# Patient Record
Sex: Male | Born: 1950
Health system: Southern US, Community
[De-identification: ages and names within clinical notes are randomized; demographics above are authoritative.]

## PROBLEM LIST (undated history)

## (undated) DIAGNOSIS — C801 Malignant (primary) neoplasm, unspecified: Secondary | ICD-10-CM

## (undated) DIAGNOSIS — I219 Acute myocardial infarction, unspecified: Secondary | ICD-10-CM

## (undated) DIAGNOSIS — E785 Hyperlipidemia, unspecified: Secondary | ICD-10-CM

## (undated) DIAGNOSIS — R9430 Abnormal result of cardiovascular function study, unspecified: Secondary | ICD-10-CM

## (undated) DIAGNOSIS — H35719 Central serous chorioretinopathy, unspecified eye: Secondary | ICD-10-CM

## (undated) DIAGNOSIS — I2510 Atherosclerotic heart disease of native coronary artery without angina pectoris: Secondary | ICD-10-CM

## (undated) DIAGNOSIS — K219 Gastro-esophageal reflux disease without esophagitis: Secondary | ICD-10-CM

## (undated) DIAGNOSIS — I10 Essential (primary) hypertension: Secondary | ICD-10-CM

## (undated) DIAGNOSIS — I00 Rheumatic fever without heart involvement: Secondary | ICD-10-CM

## (undated) DIAGNOSIS — M1990 Unspecified osteoarthritis, unspecified site: Secondary | ICD-10-CM

## (undated) DIAGNOSIS — S62101A Fracture of unspecified carpal bone, right wrist, initial encounter for closed fracture: Secondary | ICD-10-CM

## (undated) DIAGNOSIS — N529 Male erectile dysfunction, unspecified: Secondary | ICD-10-CM

## (undated) DIAGNOSIS — B029 Zoster without complications: Secondary | ICD-10-CM

## (undated) HISTORY — PX: OTHER SURGICAL HISTORY: SHX169

## (undated) HISTORY — DX: Male erectile dysfunction, unspecified: N52.9

## (undated) HISTORY — PX: CARDIAC CATHETERIZATION: SHX172

## (undated) HISTORY — PX: SPINE SURGERY: SHX786

## (undated) HISTORY — DX: Essential (primary) hypertension: I10

## (undated) HISTORY — PX: CORONARY ANGIOPLASTY: SHX604

## (undated) HISTORY — DX: Hyperlipidemia, unspecified: E78.5

## (undated) HISTORY — DX: Fracture of unspecified carpal bone, right wrist, initial encounter for closed fracture: S62.101A

## (undated) HISTORY — DX: Zoster without complications: B02.9

## (undated) HISTORY — PX: JOINT REPLACEMENT: SHX530

## (undated) HISTORY — DX: Unspecified osteoarthritis, unspecified site: M19.90

## (undated) HISTORY — DX: Central serous chorioretinopathy, unspecified eye: H35.719

---
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
## 1997-09-20 ENCOUNTER — Other Ambulatory Visit: Admission: RE | Admit: 1997-09-20 | Discharge: 1997-09-20 | Payer: Self-pay | Admitting: Rheumatology

## 1999-10-23 ENCOUNTER — Ambulatory Visit (HOSPITAL_COMMUNITY): Admission: RE | Admit: 1999-10-23 | Discharge: 1999-10-23 | Payer: Self-pay | Admitting: Gastroenterology

## 1999-10-23 ENCOUNTER — Encounter (INDEPENDENT_AMBULATORY_CARE_PROVIDER_SITE_OTHER): Payer: Self-pay | Admitting: Specialist

## 2001-09-05 ENCOUNTER — Encounter: Payer: Self-pay | Admitting: Rheumatology

## 2001-09-05 ENCOUNTER — Encounter: Admission: RE | Admit: 2001-09-05 | Discharge: 2001-09-05 | Payer: Self-pay | Admitting: Rheumatology

## 2002-04-27 ENCOUNTER — Inpatient Hospital Stay (HOSPITAL_COMMUNITY): Admission: RE | Admit: 2002-04-27 | Discharge: 2002-04-30 | Payer: Self-pay | Admitting: Neurosurgery

## 2004-07-25 ENCOUNTER — Encounter: Admission: RE | Admit: 2004-07-25 | Discharge: 2004-07-25 | Payer: Self-pay | Admitting: Rheumatology

## 2004-08-23 ENCOUNTER — Ambulatory Visit (HOSPITAL_COMMUNITY): Admission: RE | Admit: 2004-08-23 | Discharge: 2004-08-23 | Payer: Self-pay | Admitting: Gastroenterology

## 2006-11-13 ENCOUNTER — Encounter: Admission: RE | Admit: 2006-11-13 | Discharge: 2006-11-13 | Payer: Self-pay | Admitting: Rheumatology

## 2007-03-04 ENCOUNTER — Encounter: Admission: RE | Admit: 2007-03-04 | Discharge: 2007-03-04 | Payer: Self-pay | Admitting: Rheumatology

## 2007-03-20 ENCOUNTER — Inpatient Hospital Stay (HOSPITAL_COMMUNITY): Admission: RE | Admit: 2007-03-20 | Discharge: 2007-03-23 | Payer: Self-pay | Admitting: Specialist

## 2008-04-16 DIAGNOSIS — B029 Zoster without complications: Secondary | ICD-10-CM

## 2008-04-16 HISTORY — DX: Zoster without complications: B02.9

## 2010-08-29 NOTE — H&P (Signed)
Marcus Peterson, Marcus Peterson             ACCOUNT NO.:  [id number]   MEDICAL RECORD NO.:  [id number]         PATIENT TYPE:  LINP   LOCATION:                               FACILITY:  [facility]   PHYSICIAN:  Erasmo Leventhal, M.D.DATE OF BIRTH:  1950-07-12   DATE OF ADMISSION:  03/20/2007  DATE OF DISCHARGE:                              HISTORY & PHYSICAL   CHIEF COMPLAINT:  End-stage osteoarthritis of left knee with rheumatoid  arthritis.   BRIEF HISTORY:  This is a 60 year old gentleman with a history  rheumatoid arthritis with advanced degeneration of his left knee that  has failed conservative treatment.  After discussion of treatment,  benefits, risks and options, the patient is now scheduled for total knee  arthroplasty of his left knee.  He will stop his methotrexate and Enbrel  prior to surgery per Dr. Katina Degree recommendations.   PAST MEDICAL HISTORY:   DRUG ALLERGIES:  None.   CURRENT MEDICATIONS:  1. Methotrexate 2.5 mg 8 tablets each Friday.  2. Benazepril 10 mg p.o. daily.  3. Folic acid 1 mg p.o. daily.  4. Enbrel 50 mg one shot each Sunday.  5. Vitamin D 1000 units a day.  6. Calcium 600 mg per day.   PREVIOUS SURGERIES:  1. Tonsillectomy.  2. Lumbar laminectomy.  3. Elbow debridement.   SERIOUS MEDICAL ILLNESSES:  1. Rheumatoid arthritis.  2. Hypertension.   FAMILY HISTORY:  Positive for coronary artery disease, hypertension and  cancer.   SOCIAL HISTORY:  The patient is married.  He is a Consulting civil engineer.  He lives at home.  He does not smoke or drink.   REVIEW OF SYSTEMS:  NEUROLOGIC:  Negative for headache, blurry vision or  dizziness.  PULMONARY:  Negative for shortness breath, PND and  orthopnea.  CARDIOVASCULAR:  Negative for chest pain, palpitations.  GI:  Negative for ulcers, hepatitis.  GU:  Negative for urinary tract  difficulty.  MUSCULOSKELETAL:  Positive as in HPI.   PHYSICAL EXAMINATION:  VITAL SIGNS:  Blood pressure 140/100,  respirations  18, pulse 72 and regular.  GENERAL APPEARANCE:  This is a well-developed, well-nourished gentleman  in no acute distress.  HEENT:  Head normocephalic.  Nose patent.  Ears patent.  Pupils equal,  round, react to light.  Throat without injection.  NECK:  Supple without adenopathy.  Carotids 2+ without bruit.  CHEST:  Clear to auscultation.  No rales or rhonchi.  Respirations 18.  HEART:  Regular rate  and rhythm at 72 beats per minute without murmur.  ABDOMEN:  Soft, active bowel sounds.  No masses, organomegaly.  NEUROLOGIC:  Patient alert and oriented  to time, place and person.  Cranial nerves II-XII grossly intact.  EXTREMITIES:  Shows the stigmata of rheumatoid arthritis with synovitis  of multiple joints.  His left knee shows 3 degrees flexion and  retraction with further flexion to 135 degrees.  Dorsalis pedis and  posterior tibial pulses are 1+.  Sensation and circulation are intact.   X-rays show end-stage degenerative change of the left knee.   IMPRESSION:  End-stage degenerative change of the left knee with  rheumatoid arthritis.   PLAN OF ACTION:  Total knee arthroplasty, left knee sinus.      [name]. Chabon, P.A.    ______________________________  Erasmo Leventhal, M.D.    SJC/MEDQ  D:  02/18/2007  T:  02/18/2007  Job:  [id number]

## 2010-08-29 NOTE — Op Note (Signed)
Marcus Peterson, Marcus Peterson             ACCOUNT NO.:  [id number]   MEDICAL RECORD NO.:  [id number]          PATIENT TYPE:  INP   LOCATION:  0006                         FACILITY:  [facility]   PHYSICIAN:  Erasmo Leventhal, M.D.DATE OF BIRTH:  1950/10/12   DATE OF PROCEDURE:  03/20/2007  DATE OF DISCHARGE:                               OPERATIVE REPORT   PREOPERATIVE DIAGNOSIS:  Left knee end-stage rheumatoid arthritis.   POSTOPERATIVE DIAGNOSIS:  Left knee end-stage rheumatoid arthritis.   PROCEDURE:  Left total knee arthroplasty.   SURGEON:  Erasmo Leventhal, M.D.   ASSISTANT:  [name]. Chabon, P.A.-C.   ANESTHESIA:  General with femoral nerve block.   ESTIMATED BLOOD LOSS:  Less than 50 mL.   DRAINS:  One Hemovac.   COMPLICATIONS:  None.   TOURNIQUET TIME:  1 hour and 15 minutes at 300 mmHg.   COMPLICATIONS:  None.   DISPOSITION:  PACU stable.   DESCRIPTION OF PROCEDURE:  The patient was counseled in the holding  area.  The correct side was identified.  He was taken to operating room  and placed in the supine position in upright position.  A  spinal was  attempted but unsuccessful by the anesthesiologist.  He was laid supine  and placed under general anesthesia.  A Foley catheter was placed  utilizing sterile technique by the OR circulating nurse.  All  extremities were well-padded and bumped.  The left knee was examined.  He had a 3-degree flexion contracture, flexed to 115 degrees.  He was  elevated, prepped with DuraPrep, and draped in sterile fashion.  Exsanguinated with an Esmarch, and tourniquet was inflated to 300 mmHg.   A straight midline incision was made through the skin and subcutaneous  tissue.  Medial and lateral soft tissue flaps were developed at the  appropriate level.  Medial parapatellar arthrotomy was performed.  Proximal medial soft tissue release was done.  The patella was retracted  out of the way but not everted.  He had end-stage arthritic  changes,  bone against bone contact.  The cruciate ligaments were resected.  A  starter hole was made in the distal femur.  The canal was irrigated  until the effluent was clear.  Intramedullary rod was gently placed.  I  chose a 5-degree valgus cut and took a 10-mm cut off the distal femur.  The femur was found to be a size #5.  Rotation marks were set, and the  cutting block was applied.  The distal femur was cut to fit a size 5.  Medial and lateral menisci removed under direct visualization.  Geniculate vessels were coagulated.  Posterior neurovascular structures  were thawed of and protected throughout the entire case.  The tibial  eminence was resected.  The proximal was felt to be a size 5.  Osteophytes removed from the proximal and medial tibia.  Central aspect  was identified with reamer, step reamer, and canal was irrigated until  the effluent was clear.  Intramedullary rod was gently placed.  I chose  a 10-mm cut based upon the lateral side which was the least deficient  at  a 0-degree slope.  Posteromedial,  posterolateral femoral osteophytes  were removed under direct visualization.  Flexion/extension blocks for a  10 insert were well-balanced.  Tibial base plate was applied.  Rotation  covers were set, reamer, punch.  Femoral box cut was now performed.  At  this time, the size 5 tibia, size 5 femur, 10 insert well-balanced in  range of motion, soft tissue balance.  The patella was found to be a  size 38.  The appropriate amount of bone was resected.  __________  holes were made, patella button was applied.  We had anatomic  patellofemoral tracking.  All trials were removed.  The knee was  irrigated with pulsatile lavage.  Utilizing modern cement technique, all  components were cemented into place, size 5 tibia, size 5 femur, 38  patella.  He also had a cyst on the medial femoral condyle, and it was  debrided and appropriately filled with cement.  It was 4 mm deep by 4  mm.  At  this time, with trials of a 10 and 12.5 insert, with 12.5 insert  we had full extension, flexion to 120 __________ .  He was stable to  varus and valgus stress, 0-90 degrees of flexion, well-balanced in  flexion/extension.  Patellofemoral tracking was anatomic.  Trials were  removed, excess cement was removed, and the knee was again irrigated  with pulsatile lavage.  At this time, a final 12.5-mm posterior  stabilized rotating platform tibial insert was implanted.  He then had a  well-balanced, well-aligned, well-tracking knee.  The sequential closure  in layers were done.  The arthrotomy was closed with 90 degrees of  flexion with Vicryl, subcu Vicryl, the skin with a subcuticular Monocryl  suture.  Steri-Strips applied along with sterile dressing.  Tourniquet  deflated.  Normal circulation to foot and ankle at end of the case.  A  gram of Ancef given intravenously at the end of the case.  A femoral  nerve block was now administered by the anesthesiologist.   He was then awakened.  He was taken from the operating room to the PACU  in stable condition.  Sponge and needle counts correct.  No  complications or problems.  Help with surgical technique and decision  making, Mr. Leilani Able, P.A.-C. assisted throughout the entire case.   Components utilized were the [employer].  Sigma.  Size 5 femur, size 5 tibia, 12.5 posterior stabilized rotating  platform tibial insert, and a 38-mm all poly patella.  All cemented.           ______________________________  Erasmo Leventhal, M.D.     RAC/MEDQ  D:  03/20/2007  T:  03/20/2007  Job:  [id number]   cc:   Demetria Pore. Coral Spikes, M.D.  Fax: [phone]

## 2010-09-01 NOTE — Op Note (Signed)
Marcus Peterson, Marcus Peterson NO.:  [id number]   MEDICAL RECORD NO.:  [id number]                   PATIENT TYPE:  INP   LOCATION:  3040                                 FACILITY:  [facility]   PHYSICIAN:  Cristi Loron, M.D.            DATE OF BIRTH:  April 07, 1951   DATE OF PROCEDURE:  04/27/2002  DATE OF DISCHARGE:                                 OPERATIVE REPORT   PREOPERATIVE DIAGNOSES:  1. Lumbar 4-5 Grade I acquired spondylolisthesis.  2. Spinal stenosis.  3. Degenerative disk disease.  4. Lumbar radiculopathy.  5. Lumbago.   POSTOPERATIVE DIAGNOSES:  1. Lumbar 4-5 Grade I acquired spondylolisthesis.  2. Spinal stenosis.  3. Degenerative disk disease.  4. Lumbar radiculopathy.  5. Lumbago.   OPERATION PERFORMED:  1. Lumbar 4 Keel procedure.  2. Lumbar 4-5 posterior lumbar interbody fusion.  3. Placement of bilateral tangent interbody dowels (10 x 24 millimeters).  4. Posterior nonsegmental instrumentation, lumbar 4-5, with CP Horizon M10     titanium pedicle screws and rods.  5. Posterolateral arthrodesis with local morselized autograft bone and Vitox     bone scaffolding.   SURGEON:  Cristi Loron, M.D.   ASSISTANT:  Coletta Memos, M.D.   ANESTHESIA:  General endotracheal.   ESTIMATED BLOOD LOSS:  Three-hundred-fifty cubic centimeters.   SPECIMENS:  None.   DRAINS:  None.   COMPLICATIONS:  None.   BRIEF HISTORY:  The patient is a 60 year old white male who suffers from  back and leg pain, and who has failed medical management.  He was worked up  with a lumbar MRI, which demonstrated a Grade I acquired spondylolisthesis  at L4-5 with severe spinal stenosis and degenerative disk disease.  I  discussed the various treatment options with the patient including surgery.  The patient weighed the risks, benefits and alternatives to surgery, and  decided to proceed with the operation.   DESCRIPTION OF OPERATION:  The patient was brought  to the operating room by  the anesthesia team.  General endotracheal anesthesia was induced.  The  patient was then turned to the prone position on the Wilson frame.  His  lumbosacral region was then shaved and prepared with Betadine scrub and  Betadine solution.  Sterile drapes were applied.  I then injected the area  to be incised with Marcaine with epinephrine solution.   I used a scalpel to make a linear midline incision over the L4-5 interspace.  I used electrocautery to dissect down to the thoracolumbar fascia.  I  divided the fascia bilaterally performing a bilateral subperiosteal  dissection, shifting the paraspinous musculature from the spinous process of  the lamina of L4 and L5.  I inserted the McCullough retractor for exposure  and then obtained intraoperative radiograph to confirm my location.  We then  began at L4-5 by incising the L4-5 and L3-4 interspinous ligaments.  We used  the [employer] rongeur to  remove the L4 spinous process and part of the L4  lamina.  This bone was saved and later used during the fusion process after  it was cleared of soft tissue.  We then used the high-speed drill to perform  bilateral L4 laminotomies as well as to remove the medial aspect of the L4-5  facet joints.   We completed the L4 laminectomy with the Kerrison punch removing the L4-5  ligamentum flavum, the L4 lamina and the L3-4 ligamentum flavum.  We also  used the Kerrison punch to remove the abnormal pars region and remove the  excess ligamentum flavum from the lateral recess.  We performed a generous  foraminotomy about the bilateral L4 and L5 nerve roots.  At this point we  had a good decompression.   We now turned our attention to diskectomy.  We freed up the thecal sac in  the L5 nerve root down through the epidural tissue and then carefully  retracted the neural structures medially with the D'Errico retractor, and  incised the L4-5 intervertebral disk.  This was done bilaterally.   We used  the Epstein and Silver curets, and the pituitary forceps to perform  aggressive bilateral diskectomy.   We now turned our attention to posterior lumbar interbody fusion.  We used  the curets and chisel to prepare the vertebral bone plates at X5-2 and then  inserted 10 x 24 mm tangent bone grafts into the L4-5 interspace, of course,  after retracting the neural structures out of harms way with the D'Errico  retractors.  We filled in between and lateral to the bone grafts with a  combination of local morselized autograft bone and Vitox bone scaffolding  completing the posterolateral arthrodesis.   We now turned our attention to the posterior nonsegmental instrumentation.  We used electrocautery to expose the transverse process of L4 and L5  bilaterally.  Then under fluoroscopic guidance we used the high-speed drill  to decorticate posterior to the bilateral L4 and L5 pedicles.  We then  cannulated the pedicles with a pedicle probe, tapped the pedicles and then  felt the interior of the pedicles with the straight ball probe to make sure  there were no cortical breaches.  We then inserted 6.5 x 45 mm pedicle  screws bilaterally at L4, sealing it bilaterally at L5 and a 6.5 x 50 mm  pedicle screw on the left at L4 and 6.5 x 55 mm pedicle screw on the right  at L4.  This was all done under fluoroscopic guidance.  We then palpated  along the medial aspect of the bilateral L4 and L5 pedicles and noted that  there were no cortical breaches, and the L4 and L5 nerve roots were not  harmed in any way.  We then connected the unilateral pedicle screws with the  appropriate length Iodamic rod and then generally compressed the construct,  and secured the rods and screws into place with the appropriate caps, and  torque wrenches.   We now turned out attention to the posterior arthrodesis.  We used the high-  speed drill to decorticate the remainder of the L4-5 facet and drill the synovium of  the L4-5 facet joint.  We also decorticated the remainder of the  pars region and the bilateral transverse process of L4 and L5.  We laid a  combination of local morselized autograft bone, which was obtained during  decompression and cleared of soft tissue as well as Vitox bone scaffolding  over the decorticated  posterior structures particularly in the  posterolateral arthrodesis.   We then copiously irrigated the wound out with bacitracin solution.  We  obtained stringent hemostasis with bipolar electrocautery.  We inspected the  thecal sac and the bilateral L4 and L5 nerve roots, and noted the _______  were well decompressed.  We ten removed the retractor and then  reapproximated the patient's thoracolumbar fascia with interrupted #1 Vicryl  suture, subcutaneous tissue with interrupted 3-0 Vicryl suture and the skin  with Steri-strips and Benzoin.  The wound was then coated with bacitracin  ointment.  Sterile dressing was applied.  The drapes were removed.   The patient was returned to the supine position where he was extubated by  the anesthesia team and transported to the post anesthesia care unit in  stable condition.   All sponge, instrument and needle counts were correct at the end of this  case.                                                 Cristi Loron, M.D.    JDJ/MEDQ  D:  04/27/2002  T:  04/27/2002  Job:  [id number]

## 2010-09-01 NOTE — Op Note (Signed)
NAMEKALIL, WOESSNER             ACCOUNT NO.:  [id number]   MEDICAL RECORD NO.:  [id number]          PATIENT TYPE:  AMB   LOCATION:  ENDO                         FACILITY:  [facility]   PHYSICIAN:  Danise Edge, M.D.   DATE OF BIRTH:  10-25-1950   DATE OF PROCEDURE:  08/23/2004  DATE OF DISCHARGE:                                 OPERATIVE REPORT   PROCEDURE INDICATION:  Mr. Owenn Rothermel is a 60 year old male born Aug 26, 1950.  In 2001, he underwent a diagnostic colonoscopy to evaluate guaiac  positive stool.  Three small adenomatous polyps were removed from his colon.  He is scheduled for a surveillance colonoscopy with polypectomy to prevent  colon cancer.   ENDOSCOPIST:  Danise Edge, M.D.   PREMEDICATION:  1.  Versed 9 mg.  2.  Demerol 100 mg.   PROCEDURE:  After obtaining informed consent, Mr. Mabus was placed in the  left lateral decubitus position.  I administered intravenous Demerol and  intravenous Versed to achieve conscious sedation for the procedure.  The  patient's blood pressure, oxygen saturation and cardiac rhythm were  monitored throughout the procedure and documented in the medical record.   Anal inspection and digital rectal exam were normal.  The prostate was non-  nodular.  The Olympus adjustable pediatric colonoscope was introduced into  the rectum and advanced to the cecum.  A normal appearing ileocecal valve  and appendiceal orifice were visualized.  Colonic preparation for the exam  today was excellent.   Rectum normal.   Sigmoid colon and descending colon normal.   Splenic flexure normal.   Transverse colon normal.   Hepatic flexure normal.   Ascending colon normal.   Cecum and ileocecal valve normal.   ASSESSMENT:  Normal screening proctocolonoscopy to the cecum.  No endoscopic  evidence for the presence of recurrent colorectal neoplasia.   RECOMMENDATIONS:  Repeat colonoscopy in 5 years.      MJ/MEDQ  D:  08/23/2004  T:   08/23/2004  Job:  [id number]   cc:   Demetria Pore. Coral Spikes, M.D.  [address]  [address]  [place]  [place] [address]  Fax: [phone]

## 2010-09-01 NOTE — Op Note (Signed)
[facility]  Patient:    Marcus Peterson, Marcus Peterson                      MRN: [id number] Proc. Date: 10/23/99 Attending:  Verlin Grills, M.D. CC:         Demetria Pore. Coral Spikes, M.D.                           Operative Report  REFERRING PHYSICIAN:  Demetria Pore. Coral Spikes, M.D.  PROCEDURE PERFORMED:  Colonoscopy.  ENDOSCOPIST:  Verlin Grills, M.D.  INDICATIONS FOR PROCEDURE:  The patient is a 60 year old male referred by Dr. Coral Spikes for colonoscopy to evaluate hemoccult positive stool. [name] mother developed colon cancer in her early 41s. I discussed with him the complications associated with colonoscopy and polypectomy including intestinal bleeding and intestinal perforation. [name] has signed the operative permit.  PREMEDICATION:  Demerol 100 mg, Versed 10 mg.  ENDOSCOPE:  Pediatric Olympus video colonoscope.  DESCRIPTION OF PROCEDURE:  After obtaining informed consent, the patient was placed in the left lateral decubitus position.  I administered intravenous Demerol and intravenous Versed to achieve sedation for the procedure.  The patients blood pressure, oxygen saturation and cardiac rhythm were monitored throughout the procedure and documented in the medical record.  Anal inspection was normal.  Digital rectal exam revealed a nonnodular prostate.  The Olympus pediatric video colonoscope was then introduced into the rectum and under direct vision, advanced to the cecum as identified by a normal-appearing ileocecal valve and appendiceal orifice.  Colonic preparation for the exam today was excellent.  Rectum:  Normal.  Sigmoid colon:  At 30 cm from the anal verge, a 3 mm sessile polyp was removed with the electrocautery snare and submitted for pathological interpretation.  Descending colon:  Normal.  Splenic flexure:  From the hepatic flexure a 2 mm sessile polyp was removed with the electrocautery snare and submitted for  pathological interpretation.  Transverse colon:  Normal.  Hepatic flexure:  Normal.  Ascending colon:  From the distal ascending colon, a 5 mm sessile polyp was removed with the electrocautery snare and submitted for pathological interpretation.  Cecum and ileocecal valve:  Normal.  ASSESSMENT:  A 5 mm polyp was removed from the distal ascending colon, a 2 mm polyp was removed from the splenic flexure, and a 3 mm polyp was removed from the sigmoid colon and 30 cm from the anal verge.  RECOMMENDATIONS:  If polyps returned adenomatous, [name] should undergo a repeat colonoscopy in approximately three years. DD:  10/23/99 TD:  10/23/99 Job: [id number]

## 2010-09-01 NOTE — Discharge Summary (Signed)
   NAMEDARELD, MCAULIFFE NO.:  [id number]   MEDICAL RECORD NO.:  [id number]                   PATIENT TYPE:  INP   LOCATION:  3040                                 FACILITY:  [facility]   PHYSICIAN:  Cristi Loron, M.D.            DATE OF BIRTH:  [birth year]   DATE OF ADMISSION:  04/27/2002  DATE OF DISCHARGE:  04/30/2002                                 DISCHARGE SUMMARY   For full details of admission refer to history and physical.   BRIEF HISTORY:  The patient is a 60 year old white male who suffers from  back and leg pain.  He has failed medical management, he was worked up with  a lumbar MRI which demonstrated grade 1 acquired spondylolisthesis in L4-5  with severe spinal stenosis and degenerative disc disease.  I discussed the  various treatment options with him including surgery and patient weighed the  risks and benefits and alternative to surgery and decided to proceed with  the operation.   HOSPITAL COURSE:  I admitted the patient to [facility] on 04/27/02.  On day of admission I performed an L4 Gill procedure with an L4-5 posterior  lumbar interbody fusion with rods and posterior lateral arthrodesis.  The  surgery went well without complications.  For full details of this  operation, please refer to typed operative report.   POSTOPERATIVE COURSE:  The patient's postoperative course was unremarkable.  By postop day 3 he was afebrile.  Vitals were stable, he was eating well,  ambulating well.  His wound was healing well without signs of infection and  requested discharge to home and he was therefore discharged home on 04/30/02.   DISCHARGE INSTRUCTIONS:  The patient was given written discharge  instructions to followup with me in 4 weeks.   DISCHARGE PRESCRIPTIONS:  1. Percocet 10/325 #60.  2. Valium 5 mg #50.   FINAL DIAGNOSES:  1. L4-5 grade 1 acquired spondylolisthesis.  2. Spinal stenosis.  3. Degenerative disease.  4.  Lumbago.  5. Lumbar radiculopathy.   PROCEDURE PERFORMED:  L4 Gill procedure and L4-5 posterior lumbar interbody  fusion, placement of bilateral Tangent interbody bone dowels, posterior  __________ segmental instrumentation L4-5 with CD horizon M10 titanium  pedicle screws and rods, posterior lateral arthrodesis with local morselized  autograft bone and __________ bone scaffolding.                                               Cristi Loron, M.D.   JDJ/MEDQ  D:  05/21/2002  T:  05/22/2002  Job:  [id number]

## 2010-09-01 NOTE — Discharge Summary (Signed)
NAMEWYNNE, Marcus Peterson             ACCOUNT NO.:  [id number]   MEDICAL RECORD NO.:  [id number]          PATIENT TYPE:  INP   LOCATION:  1618                         FACILITY:  [facility]   PHYSICIAN:  Erasmo Leventhal, M.D.DATE OF BIRTH:  [birth year]   DATE OF ADMISSION:  03/20/2007  DATE OF DISCHARGE:  03/23/2007                               DISCHARGE SUMMARY   ADMITTING DIAGNOSIS:  End-stage osteoarthritis with rheumatoid arthritis  of left knee.   DISCHARGE DIAGNOSIS:  End-stage osteoarthritis with rheumatoid arthritis  of left knee.   OPERATION:  Total knee arthroplasty, left knee.   BRIEF HISTORY:  This is a 60 year old gentleman with a history of  rheumatoid arthritis with advanced degeneration was on his left knee  that failed conservative treatment.  After discussion of treatment  options, risks and benefits, the patient is now scheduled for total  arthroplasty left knee.   LABORATORY VALUES:  Admission CBC showed hemoglobin low at 11.6,  hematocrit low at 33.2, platelets low of 146, hemoglobin and hematocrit  reached a low of 10.5 and 30.2 on the 7th; platelets were back to  normal.  PT/PTT within normal limits.  The patient was mildly  hyponatremic through admission with 133 and 132. Glucose ran mildly  elevated at 103 and 110.  Urinalysis normal.   COURSE IN THE HOSPITAL:  The patient tolerated the operative procedure  well.  The 1st postoperative day, vital signs were stable and he was  afebrile.  Hemoglobin was 11.6, hematocrit 33.2, platelets 146,000.  Sodium 133.  Lungs clear.  Heart sounds were normal.  Calves were  negative.  Drain was removed without difficulty.  Second postoperative  day, vital signs were stable.  He was afebrile.  Dressing was changed;  his wound was benign.  Calves were negative and plans were made for  discharge on Sunday.  On postoperative day #3, his vital signs were  stable; he was afebrile.  Dressings was changed; his wound was  benign.  His calves were negative and the patient was subsequently discharged  home for followup in the office.   CONDITION ON DISCHARGE:  Improved.   DISCHARGE MEDICATIONS:  1. Percocet 5/325 one to two q.4-6 h. p.r.n. pain.  2. Robaxin 500 mg one p.o. q.8 h. p.r.n. spasm.  3. Lovenox 30 mg one shot each day at 6 a.m. and 6 p.m. for a total of      10  days postop.  4. Trinsicon 1 twice a day for anemia.  5. He is not take his Enbrel or methotrexate for 2 weeks after      surgery.  After his Lovenox shots run out, he can take 180-mg      aspirin a day for 3 weeks.   WOUND CARE:  He is to keep his wound clean and dry for 3 weeks.   DIET:  He will have a regular diet.   FOLLOWUP:  He will return to the office in 2 weeks for postop  appointment or sooner p.r.n. problems.      [name]. Chabon, P.A.    ______________________________  Erasmo Leventhal, M.D.  SJC/MEDQ  D:  04/02/2007  T:  04/02/2007  Job:  [id number]

## 2011-01-04 ENCOUNTER — Ambulatory Visit (INDEPENDENT_AMBULATORY_CARE_PROVIDER_SITE_OTHER): Payer: BC Managed Care – PPO | Admitting: General Surgery

## 2011-01-04 ENCOUNTER — Encounter (INDEPENDENT_AMBULATORY_CARE_PROVIDER_SITE_OTHER): Payer: Self-pay | Admitting: General Surgery

## 2011-01-04 VITALS — BP 138/90 | HR 70 | Temp 97.1°F | Resp 16 | Ht 70.5 in | Wt 249.4 lb

## 2011-01-04 DIAGNOSIS — R1084 Generalized abdominal pain: Secondary | ICD-10-CM

## 2011-01-04 DIAGNOSIS — K4090 Unilateral inguinal hernia, without obstruction or gangrene, not specified as recurrent: Secondary | ICD-10-CM

## 2011-01-04 NOTE — Progress Notes (Signed)
Chief Complaint  Patient presents with  . Other    Eval RLQP questionable hernia    HPI Marcus Peterson is a 60 y.o. male.  Referred by Dr. Merlene Laughter HPI This is a 60 year old male who for the past month or so has begun to have a right groin swelling and discomfort. It has become difficult to work for him. He also noticed that his bowel movements are a little bit order to pass although he does have a normal colonoscopy in the recent past. He notices some gurgling and bloating in his abdomen. He also was noticed new onset of epigastric pain. She does not have any fevers. This has been getting worse and the only thing that will relieve it is when he lies down. He has no difficulty with urination. Past Medical History  Diagnosis Date  . Arthritis   . Hypertension     Past Surgical History  Procedure Date  . Spine surgery     lumbar fusion  . Joint replacement     left knee    Family History  Problem Relation Age of Onset  . Cancer Mother     colon    Social History History  Substance Use Topics  . Smoking status: Never Smoker   . Smokeless tobacco: Not on file  . Alcohol Use: No    No Known Allergies  Current Outpatient Prescriptions  Medication Sig Dispense Refill  . BENAZEPRIL HCL PO Take by mouth daily.        . Calcium Carbonate-Vitamin D (CALCIUM + D PO) Take by mouth daily.        Marland Kitchen etanercept (ENBREL) 50 MG/ML injection Inject 50 mg into the skin once a week.        Marland Kitchen FOLIC ACID PO Take by mouth daily.        . methotrexate 2.5 MG tablet Take by mouth once a week. Patient takes 7 of the 2.5 once per week.       . Multiple Vitamin (MULTIVITAMIN) tablet Take 1 tablet by mouth daily.          Review of Systems Review of Systems  Musculoskeletal: Positive for back pain and arthralgias.  All other systems reviewed and are negative.    Blood pressure 138/90, pulse 70, temperature 97.1 F (36.2 C), temperature source Temporal, resp. rate 16, height 5'  10.5" (1.791 m), weight 249 lb 6.4 oz (113.127 kg).  Physical Exam Physical Exam  Constitutional: He appears well-developed and well-nourished.  Eyes: No scleral icterus.  Neck: Neck supple.  Cardiovascular: Normal rate, regular rhythm and normal heart sounds.   Pulmonary/Chest: Effort normal and breath sounds normal. He has no wheezes. He has no rales.  Abdominal: Soft. He exhibits no mass. There is tenderness (mild epigastric tenderness). There is no rebound and no guarding. A hernia is present. Hernia confirmed positive in the right inguinal area (tender right groin hernia reducible ). Hernia confirmed negative in the left inguinal area.  Genitourinary: Testes normal and penis normal.  Lymphadenopathy:    He has no cervical adenopathy.       Right: No inguinal adenopathy present.       Left: No inguinal adenopathy present.     Assessment    RIH Abdominal pain    Plan    He certainly has a right inguinal hernia on his exam as well as by history. I discussed with him an open right inguinal hernia repair with mesh today. We discussed the risks  being but not limited to chronic pain, bleeding, infection, recurrence. We discussed his time out of work.  He also has a fair amount of generalized abdominal pain that has been new that could be due to his hernia I am not entirely sure about this. I think would be prudent prior to his operation in the near future to obtain a CT scan just to make sure that the remainder of his abdomen is okay. If that is fine then this may very well be due to his hernia there just not symptoms that are completely consistent with a hernia.       Marcus Peterson 01/04/2011, 10:00 AM

## 2011-01-04 NOTE — Progress Notes (Signed)
Addended byEmelia Loron on: 01/04/2011 10:21 AM   Modules accepted: Orders

## 2011-01-08 ENCOUNTER — Ambulatory Visit (HOSPITAL_COMMUNITY)
Admission: RE | Admit: 2011-01-08 | Discharge: 2011-01-08 | Disposition: A | Discharge status: Home / Self Care | Payer: BC Managed Care – PPO | Source: Ambulatory Visit | Attending: General Surgery | Admitting: General Surgery

## 2011-01-08 DIAGNOSIS — R1084 Generalized abdominal pain: Secondary | ICD-10-CM

## 2011-01-08 DIAGNOSIS — J984 Other disorders of lung: Secondary | ICD-10-CM | POA: Insufficient documentation

## 2011-01-08 DIAGNOSIS — K4090 Unilateral inguinal hernia, without obstruction or gangrene, not specified as recurrent: Secondary | ICD-10-CM | POA: Insufficient documentation

## 2011-01-08 DIAGNOSIS — R142 Eructation: Secondary | ICD-10-CM | POA: Insufficient documentation

## 2011-01-08 DIAGNOSIS — R109 Unspecified abdominal pain: Secondary | ICD-10-CM | POA: Insufficient documentation

## 2011-01-08 DIAGNOSIS — R141 Gas pain: Secondary | ICD-10-CM | POA: Insufficient documentation

## 2011-01-08 MED ORDER — IOHEXOL 300 MG/ML  SOLN
80.0000 mL | Freq: Once | INTRAMUSCULAR | Status: AC | PRN
  Administered 2011-01-08: 80 mL via INTRAVENOUS

## 2011-01-10 ENCOUNTER — Telehealth (INDEPENDENT_AMBULATORY_CARE_PROVIDER_SITE_OTHER): Payer: Self-pay

## 2011-01-10 NOTE — Telephone Encounter (Signed)
LMOM for pt notifying him that Dr Dwain Sarna did review his CT scan which was normal  Except the inguinal hernia. The pt is scheduled for the hernia repair already. If any questions to call our office.Hulda Humphrey

## 2011-01-15 ENCOUNTER — Encounter (HOSPITAL_COMMUNITY): Payer: BC Managed Care – PPO

## 2011-01-15 ENCOUNTER — Ambulatory Visit (HOSPITAL_COMMUNITY)
Admission: RE | Admit: 2011-01-15 | Discharge: 2011-01-15 | Disposition: A | Discharge status: Home / Self Care | Payer: BC Managed Care – PPO | Source: Ambulatory Visit | Attending: General Surgery | Admitting: General Surgery

## 2011-01-15 ENCOUNTER — Other Ambulatory Visit (INDEPENDENT_AMBULATORY_CARE_PROVIDER_SITE_OTHER): Payer: Self-pay | Admitting: General Surgery

## 2011-01-15 DIAGNOSIS — Z01810 Encounter for preprocedural cardiovascular examination: Secondary | ICD-10-CM | POA: Insufficient documentation

## 2011-01-15 DIAGNOSIS — Z01818 Encounter for other preprocedural examination: Secondary | ICD-10-CM

## 2011-01-15 DIAGNOSIS — I498 Other specified cardiac arrhythmias: Secondary | ICD-10-CM | POA: Insufficient documentation

## 2011-01-15 DIAGNOSIS — I10 Essential (primary) hypertension: Secondary | ICD-10-CM | POA: Insufficient documentation

## 2011-01-15 DIAGNOSIS — Z01812 Encounter for preprocedural laboratory examination: Secondary | ICD-10-CM | POA: Insufficient documentation

## 2011-01-15 DIAGNOSIS — I440 Atrioventricular block, first degree: Secondary | ICD-10-CM | POA: Insufficient documentation

## 2011-01-15 HISTORY — PX: HERNIA REPAIR: SHX51

## 2011-01-15 LAB — SURGICAL PCR SCREEN: Staphylococcus aureus: POSITIVE — AB

## 2011-01-15 LAB — BASIC METABOLIC PANEL
CO2: 23 mEq/L (ref 19–32)
Calcium: 10.4 mg/dL (ref 8.4–10.5)
Chloride: 104 mEq/L (ref 96–112)
Glucose, Bld: 75 mg/dL (ref 70–99)
Sodium: 138 mEq/L (ref 135–145)

## 2011-01-15 LAB — DIFFERENTIAL
Eosinophils Relative: 1 % (ref 0–5)
Lymphocytes Relative: 36 % (ref 12–46)
Lymphs Abs: 3.2 10*3/uL (ref 0.7–4.0)
Monocytes Absolute: 1.4 10*3/uL — ABNORMAL HIGH (ref 0.1–1.0)
Neutro Abs: 4.2 10*3/uL (ref 1.7–7.7)

## 2011-01-15 LAB — CBC
HCT: 43.9 % (ref 39.0–52.0)
MCV: 96.5 fL (ref 78.0–100.0)
Platelets: 167 10*3/uL (ref 150–400)
RBC: 4.55 MIL/uL (ref 4.22–5.81)
WBC: 8.9 10*3/uL (ref 4.0–10.5)

## 2011-01-17 ENCOUNTER — Ambulatory Visit (HOSPITAL_COMMUNITY)
Admission: RE | Admit: 2011-01-17 | Discharge: 2011-01-17 | Disposition: A | Discharge status: Home / Self Care | Payer: BC Managed Care – PPO | Source: Ambulatory Visit | Attending: General Surgery | Admitting: General Surgery

## 2011-01-17 DIAGNOSIS — I10 Essential (primary) hypertension: Secondary | ICD-10-CM | POA: Insufficient documentation

## 2011-01-17 DIAGNOSIS — K4090 Unilateral inguinal hernia, without obstruction or gangrene, not specified as recurrent: Secondary | ICD-10-CM | POA: Insufficient documentation

## 2011-01-17 DIAGNOSIS — Z01812 Encounter for preprocedural laboratory examination: Secondary | ICD-10-CM | POA: Insufficient documentation

## 2011-01-17 DIAGNOSIS — Z01818 Encounter for other preprocedural examination: Secondary | ICD-10-CM | POA: Insufficient documentation

## 2011-01-17 DIAGNOSIS — M069 Rheumatoid arthritis, unspecified: Secondary | ICD-10-CM | POA: Insufficient documentation

## 2011-01-17 DIAGNOSIS — Z01810 Encounter for preprocedural cardiovascular examination: Secondary | ICD-10-CM | POA: Insufficient documentation

## 2011-01-18 NOTE — Op Note (Addendum)
Marcus Peterson, Marcus Peterson             ACCOUNT NO.:  [id number]  MEDICAL RECORD NO.:  [id number]  LOCATION:                               FACILITY:  [facility]  PHYSICIAN:  Juanetta Gosling, MDDATE OF BIRTH:  Apr 25, 1950  DATE OF PROCEDURE:  01/17/2011 DATE OF DISCHARGE:                              OPERATIVE REPORT   PREOPERATIVE DIAGNOSIS:  Right inguinal hernia.  POSTOPERATIVE DIAGNOSIS:  Indirect right inguinal hernia.  PROCEDURE:  Right inguinal repair with Ultrapro hernia system.  SURGEON:  Juanetta Gosling, MD.  ASSISTANT:  None.  ANESTHESIA:  General.  SPECIMENS:  None.  DRAINS:  None.  COMPLICATIONS:  None.  ESTIMATED BLOOD LOSS:  Minimal.  DISPOSITION:  To recovery room in stable condition.  INDICATIONS:  A 60 year old male with a symptomatic right groin hernia that developed.  He had some generalized abdominal pain.  I thought it could be due to his hernia, but I did obtain a CT scan prior tobeginning and this was negative for any other pathology.  He and I then discussed an open right inguinal repair with mesh.  PROCEDURE IN DETAIL:  After informed consent was obtained, the patient was taken to the operating room.  He was given 1 g of intravenous cefazolin.  Sequential compression devices were placed on lower extremities prior to induction with anesthesia.  He was then placed under general anesthesia without complication.  His right groin and scrotum were then prepped and draped in a standard sterile surgical fashion.  Surgical time-out was then performed.  I infiltrated 10 mL of Exparel.  Prior to beginning, I then made a right groin incision, carried this down to his external oblique.  This was then entered through his internal or his external ring.  He had a very large indirect hernia that was noted to encircle the spermatic cord with a Penrose drain.  I then dissected the hernia sac free from the remaining structures and put this back inside the  internal ring and developed a preperitoneal space.  The remainder of his cord structures were normal.  His left lower was somewhat weakened, but there was no hernia present.  I then placed an Ultrapro hernia system.  I laid the bottom portion flat and I deployed the top portion of the bilayer, this covered the defect completely.  There was no evidence of any further hernia.  I then laid this flat, made a T-cut, wrapped it around the spermatic cord.  I tacked this into numerous positions to the pubic tubercle and inguinal ligament as well as superiorly also with 2-0 Prolene suture.  The ends were laid flat underneath the external oblique.  This mesh was in good position upon completion.  Hemostasis was observed.  I closed the external oblique with 2-0 Vicryl, Scarpa's with 3-0 Vicryl, and the skin with 4-0 Monocryl.  Dermabond was placed. I placed an additional 30 mL of Exparel throughout the right groin as well as performed an ilioinguinal nerve block.  He tolerated this well, was extubated, and transferred to recovery room in stable condition.     Juanetta Gosling, MD     MCW/MEDQ  D:  01/17/2011  T:  01/17/2011  Job:  [id number]  cc:   Hal T. Stoneking, M.D. Fax: [phone]  Electronically Signed by Emelia Loron MD on 01/25/2011 07:19:35 AM

## 2011-01-22 ENCOUNTER — Ambulatory Visit (INDEPENDENT_AMBULATORY_CARE_PROVIDER_SITE_OTHER): Payer: Self-pay | Admitting: General Surgery

## 2011-01-22 LAB — CBC
HCT: 33.2 — ABNORMAL LOW
Hemoglobin: 10.5 — ABNORMAL LOW
MCHC: 34.8
MCHC: 34.9
MCV: 95.6
MCV: 95.9
Platelets: 146 — ABNORMAL LOW
Platelets: 152
RBC: 3.15 — ABNORMAL LOW
RBC: 3.37 — ABNORMAL LOW
WBC: 10.9 — ABNORMAL HIGH
WBC: 11.7 — ABNORMAL HIGH
WBC: 9.7

## 2011-01-22 LAB — BASIC METABOLIC PANEL
BUN: 5 — ABNORMAL LOW
BUN: 7
CO2: 24
CO2: 27
CO2: 27
Calcium: 8.7
Calcium: 8.9
Chloride: 101
Chloride: 105
Creatinine, Ser: 0.76
Creatinine, Ser: 0.78
Creatinine, Ser: 0.83
GFR calc Af Amer: >60
GFR calc Af Amer: >60
Potassium: 4
Sodium: 132 — ABNORMAL LOW

## 2011-01-22 LAB — PROTIME-INR
INR: 1
Prothrombin Time: 13.1

## 2011-01-22 LAB — URINALYSIS, ROUTINE W REFLEX MICROSCOPIC
Bilirubin Urine: NEGATIVE
Nitrite: NEGATIVE
Specific Gravity, Urine: 1.018
Urobilinogen, UA: 1
pH: 5.5

## 2011-01-22 LAB — CROSSMATCH

## 2011-01-22 LAB — APTT: aPTT: 30

## 2011-02-08 ENCOUNTER — Encounter (INDEPENDENT_AMBULATORY_CARE_PROVIDER_SITE_OTHER): Payer: Self-pay | Admitting: General Surgery

## 2011-02-08 ENCOUNTER — Ambulatory Visit (INDEPENDENT_AMBULATORY_CARE_PROVIDER_SITE_OTHER): Payer: BC Managed Care – PPO | Admitting: General Surgery

## 2011-02-08 VITALS — BP 158/94 | HR 68 | Temp 97.2°F | Resp 16 | Ht 70.0 in | Wt 251.6 lb

## 2011-02-08 DIAGNOSIS — Z09 Encounter for follow-up examination after completed treatment for conditions other than malignant neoplasm: Secondary | ICD-10-CM

## 2011-02-08 NOTE — Progress Notes (Signed)
Subjective:     Patient ID: Marcus Peterson, male   DOB: 01/15/1951, 60 y.o.   MRN: [id number]  HPI This is a 60 year old male who I did a right inguinal hernia repair with mesh for a symptomatic right groin hernia about 3 weeks ago. He returns today doing well with resolution of all of his preoperative symptoms. He has no complaints at all.  Review of Systems     Objective:   Physical Exam    healing right groin incision without infection Assessment:     S/p RIH repair    Plan:     Return to full activity Return as needed

## 2012-07-24 ENCOUNTER — Other Ambulatory Visit: Payer: Self-pay | Admitting: Geriatric Medicine

## 2012-07-24 DIAGNOSIS — R4702 Dysphasia: Secondary | ICD-10-CM

## 2012-07-28 ENCOUNTER — Ambulatory Visit
Admission: RE | Admit: 2012-07-28 | Discharge: 2012-07-28 | Disposition: A | Discharge status: Home / Self Care | Payer: BC Managed Care – PPO | Source: Ambulatory Visit | Attending: Geriatric Medicine | Admitting: Geriatric Medicine

## 2012-07-28 DIAGNOSIS — R4702 Dysphasia: Secondary | ICD-10-CM

## 2012-11-03 ENCOUNTER — Other Ambulatory Visit: Payer: Self-pay | Admitting: Interventional Cardiology

## 2012-11-05 ENCOUNTER — Ambulatory Visit (HOSPITAL_COMMUNITY)
Admission: RE | Admit: 2012-11-05 | Discharge: 2012-11-05 | Disposition: A | Discharge status: Home / Self Care | Payer: BC Managed Care – PPO | Source: Ambulatory Visit | Attending: Interventional Cardiology | Admitting: Interventional Cardiology

## 2012-11-05 ENCOUNTER — Encounter (HOSPITAL_COMMUNITY)
Admission: RE | Disposition: A | Discharge status: Home / Self Care | Payer: Self-pay | Source: Ambulatory Visit | Attending: Interventional Cardiology

## 2012-11-05 ENCOUNTER — Encounter (HOSPITAL_COMMUNITY): Payer: Self-pay | Admitting: Interventional Cardiology

## 2012-11-05 DIAGNOSIS — Z79899 Other long term (current) drug therapy: Secondary | ICD-10-CM | POA: Insufficient documentation

## 2012-11-05 DIAGNOSIS — R079 Chest pain, unspecified: Secondary | ICD-10-CM | POA: Insufficient documentation

## 2012-11-05 DIAGNOSIS — I2510 Atherosclerotic heart disease of native coronary artery without angina pectoris: Secondary | ICD-10-CM | POA: Insufficient documentation

## 2012-11-05 DIAGNOSIS — E782 Mixed hyperlipidemia: Secondary | ICD-10-CM | POA: Insufficient documentation

## 2012-11-05 DIAGNOSIS — M069 Rheumatoid arthritis, unspecified: Secondary | ICD-10-CM | POA: Insufficient documentation

## 2012-11-05 DIAGNOSIS — R9439 Abnormal result of other cardiovascular function study: Secondary | ICD-10-CM | POA: Insufficient documentation

## 2012-11-05 DIAGNOSIS — Z8249 Family history of ischemic heart disease and other diseases of the circulatory system: Secondary | ICD-10-CM | POA: Insufficient documentation

## 2012-11-05 DIAGNOSIS — I10 Essential (primary) hypertension: Secondary | ICD-10-CM | POA: Insufficient documentation

## 2012-11-05 DIAGNOSIS — Z7982 Long term (current) use of aspirin: Secondary | ICD-10-CM | POA: Insufficient documentation

## 2012-11-05 DIAGNOSIS — N529 Male erectile dysfunction, unspecified: Secondary | ICD-10-CM | POA: Insufficient documentation

## 2012-11-05 DIAGNOSIS — R9430 Abnormal result of cardiovascular function study, unspecified: Secondary | ICD-10-CM

## 2012-11-05 HISTORY — DX: Abnormal result of cardiovascular function study, unspecified: R94.30

## 2012-11-05 HISTORY — PX: LEFT HEART CATHETERIZATION WITH CORONARY ANGIOGRAM: SHX5451

## 2012-11-05 SURGERY — LEFT HEART CATHETERIZATION WITH CORONARY ANGIOGRAM
Anesthesia: LOCAL

## 2012-11-05 MED ORDER — SODIUM CHLORIDE 0.9 % IV SOLN
INTRAVENOUS | Status: DC
  Administered 2012-11-05: 1000 mL via INTRAVENOUS

## 2012-11-05 MED ORDER — LIDOCAINE HCL (PF) 1 % IJ SOLN
INTRAMUSCULAR | Status: AC
  Filled 2012-11-05: qty 30

## 2012-11-05 MED ORDER — NITROGLYCERIN 0.2 MG/ML ON CALL CATH LAB
INTRAVENOUS | Status: AC
  Filled 2012-11-05: qty 1

## 2012-11-05 MED ORDER — SODIUM CHLORIDE 0.9 % IJ SOLN: 3.0000 mL | Freq: Two times a day (BID) | INTRAMUSCULAR | Status: DC

## 2012-11-05 MED ORDER — SODIUM CHLORIDE 0.9 % IV SOLN: 250.0000 mL | INTRAVENOUS | Status: DC | PRN

## 2012-11-05 MED ORDER — ASPIRIN 81 MG PO CHEW
324.0000 mg | CHEWABLE_TABLET | ORAL | Status: AC
  Administered 2012-11-05: 324 mg via ORAL

## 2012-11-05 MED ORDER — DIAZEPAM 5 MG PO TABS
ORAL_TABLET | ORAL | Status: AC
  Filled 2012-11-05: qty 1

## 2012-11-05 MED ORDER — ONDANSETRON HCL 4 MG/2ML IJ SOLN: 4.0000 mg | Freq: Four times a day (QID) | INTRAMUSCULAR | Status: DC | PRN

## 2012-11-05 MED ORDER — MIDAZOLAM HCL 2 MG/2ML IJ SOLN
INTRAMUSCULAR | Status: AC
  Filled 2012-11-05: qty 2

## 2012-11-05 MED ORDER — SODIUM CHLORIDE 0.9 % IJ SOLN: 3.0000 mL | INTRAMUSCULAR | Status: DC | PRN

## 2012-11-05 MED ORDER — VERAPAMIL HCL 2.5 MG/ML IV SOLN
INTRAVENOUS | Status: AC
  Filled 2012-11-05: qty 2

## 2012-11-05 MED ORDER — SODIUM CHLORIDE 0.9 % IV SOLN: 1.0000 mL/kg/h | INTRAVENOUS | Status: DC

## 2012-11-05 MED ORDER — ASPIRIN 81 MG PO CHEW
CHEWABLE_TABLET | ORAL | Status: AC
  Filled 2012-11-05: qty 4

## 2012-11-05 MED ORDER — HEPARIN SODIUM (PORCINE) 1000 UNIT/ML IJ SOLN
INTRAMUSCULAR | Status: AC
  Filled 2012-11-05: qty 1

## 2012-11-05 MED ORDER — DIAZEPAM 5 MG PO TABS
5.0000 mg | ORAL_TABLET | ORAL | Status: AC
  Administered 2012-11-05: 5 mg via ORAL

## 2012-11-05 MED ORDER — FENTANYL CITRATE 0.05 MG/ML IJ SOLN
INTRAMUSCULAR | Status: AC
  Filled 2012-11-05: qty 2

## 2012-11-05 MED ORDER — HEPARIN (PORCINE) IN NACL 2-0.9 UNIT/ML-% IJ SOLN
INTRAMUSCULAR | Status: AC
  Filled 2012-11-05: qty 1000

## 2012-11-05 MED ORDER — ACETAMINOPHEN 325 MG PO TABS: 650.0000 mg | ORAL_TABLET | ORAL | Status: DC | PRN

## 2012-11-05 NOTE — CV Procedure (Signed)
PROCEDURE:  Left heart catheterization with selective coronary angiography, left ventriculogram.  INDICATIONS:  Abnormal stress test, chest pain  The risks, benefits, and details of the procedure were explained to the patient.  The patient verbalized understanding and wanted to proceed.  Informed written consent was obtained.  PROCEDURE TECHNIQUE:  After Xylocaine anesthesia a 77F sheath was placed in the right radial artery with a single anterior needle wall stick.   Left coronary angiography was done using an XB LAD 3.0 guide catheter.  Difficult to engage left from right radial artery.  Right coronary angiography was done using a Judkins R4 guide catheter.  Left ventriculography was done using a pigtail catheter.  A TR band was used for hemostasis.   CONTRAST:  Total of 160cc.  COMPLICATIONS:  None.    HEMODYNAMICS:  Aortic pressure was 103/53; LV pressure was 107/3; LVEDP 14.  There was no gradient between the left ventricle and aorta.    ANGIOGRAPHIC DATA:   The left main coronary artery is widely patent.  The left anterior descending artery is a large vessel to the apex.  There is mild atherosclerosis in the mid vessel at the first diagonal.  D1 is widely patent.  Mid to distal LAD is widely patent.  The left circumflex artery is medium sized vessel with only mild irregularities.  OM1 and OM2 patent.  The right coronary artery is large dominant vessel with mild luminal irregularities.  PDA and PLA are widely patent.  LEFT VENTRICULOGRAM:  Left ventricular angiogram was done in the 30 RAO projection and revealed normal left ventricular wall motion and systolic function with an estimated ejection fraction of 60%.  LVEDP was 14 mmHg.  IMPRESSIONS:  1. Widely patent left main coronary artery. 2. Widely patent  left anterior descending artery and its branches.  Mild disease in the mid LAD at the first diagonal.   3. Widely patent  left circumflex artery and its branches. 4. Widely  patent right coronary artery. 5. Normal left ventricular systolic function.  LVEDP 14 mmHg.  Ejection fraction 60%. 6.  It was difficult to engage the coronary arteries from the right radial approach 22 tortuosity in the right subclavian.  If he required an emergency procedure in the future, I would not use a right radial approach.  RECOMMENDATION:  Continue medical therapy.  It likely appears to be a false positive stress test.  Noncardiac sources of chest discomfort should be worked up.

## 2012-11-05 NOTE — H&P (Signed)
  Date of Initial H&P: 10/31/12  History reviewed, patient examined, no change in status, stable for cath.Cath Lab Visit (complete for each Cath Lab visit)  Clinical Evaluation Leading to the Procedure:   ACS: no  Non-ACS:    Anginal Classification: CCS III  Anti-ischemic medical therapy: Minimal Therapy (1 class of medications)  Non-Invasive Test Results: Intermediate-risk stress test findings: cardiac mortality 1-3%/year  Prior CABG: No previous CABG

## 2013-05-28 ENCOUNTER — Other Ambulatory Visit: Payer: Self-pay | Admitting: Interventional Cardiology

## 2013-12-01 ENCOUNTER — Other Ambulatory Visit: Payer: Self-pay | Admitting: Interventional Cardiology

## 2014-02-18 ENCOUNTER — Other Ambulatory Visit: Payer: Self-pay

## 2014-02-19 ENCOUNTER — Other Ambulatory Visit: Payer: Self-pay

## 2014-02-19 MED ORDER — METOPROLOL SUCCINATE ER 50 MG PO TB24: ORAL_TABLET | ORAL | Status: DC

## 2014-03-25 ENCOUNTER — Encounter (HOSPITAL_COMMUNITY): Payer: Self-pay | Admitting: Interventional Cardiology

## 2014-04-14 ENCOUNTER — Other Ambulatory Visit: Payer: Self-pay | Admitting: Interventional Cardiology

## 2014-10-26 IMAGING — RF DG ESOPHAGUS
18 of 22 series · 20 of 24 positions shown · non-contrast
Comparison: None.

CLINICAL DATA: Difficulty swallowing.  Chest tightness after meals.

ESOPHAGUS/BARIUM SWALLOW/TABLET STUDY
Fluoroscopy Time: 1 minute 6 seconds.

[Series 1: run · 2 of 5 slices shown (1 of 18)]
[im 1/5]
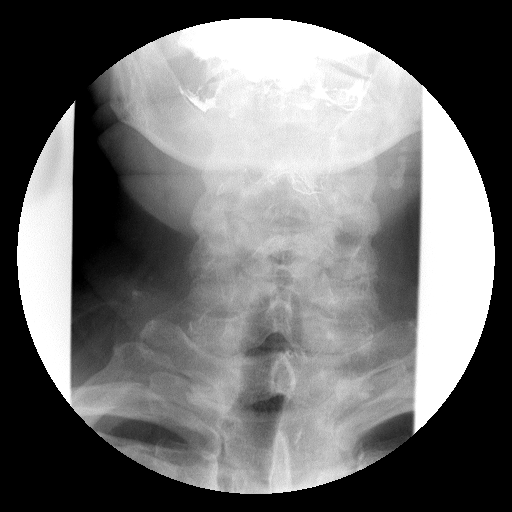
[im 5/5]
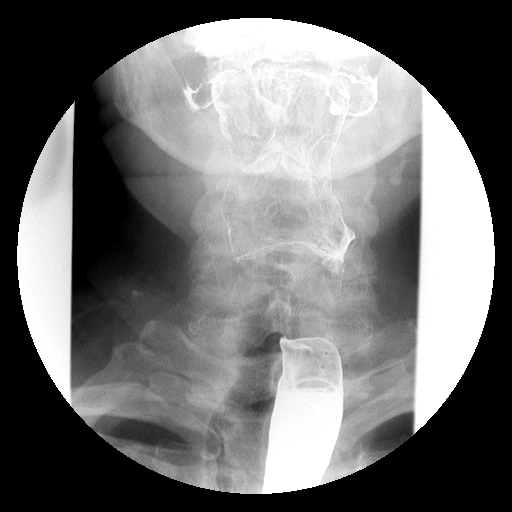

[Series 3: run · 2 of 3 slices shown (2 of 18)]
[im 1/3]
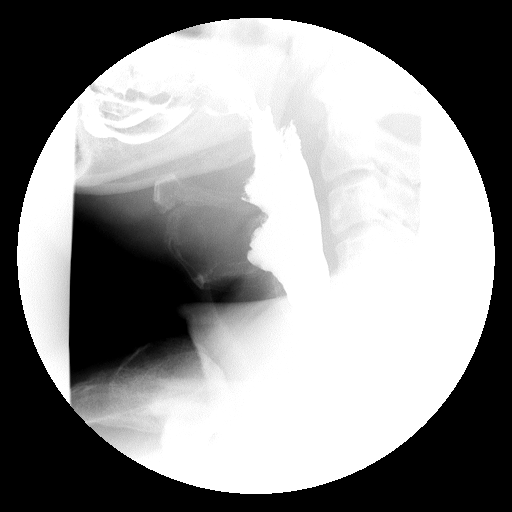
[im 3/3]
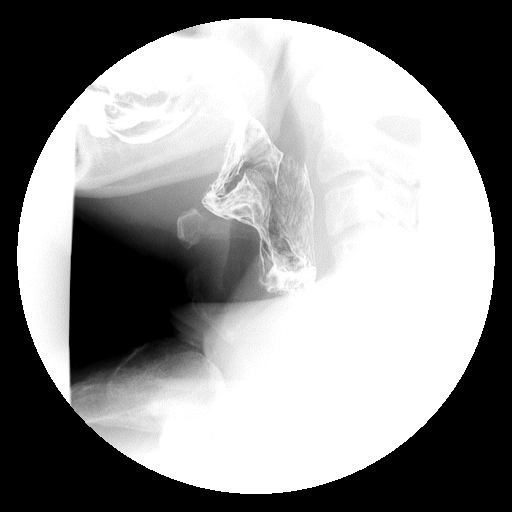

[Series 4: run · 1 of 1 slices shown (3 of 18)]
[im 1/1]
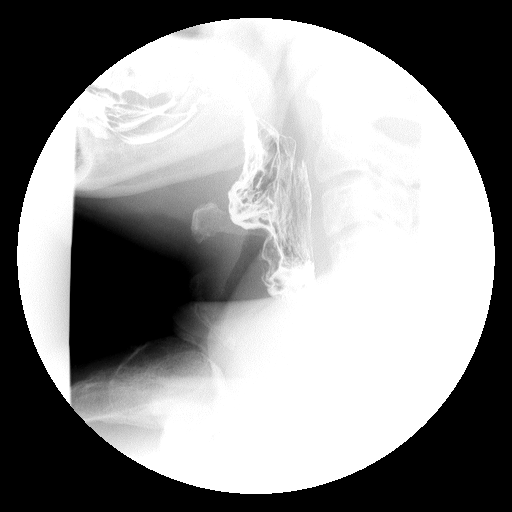

[Series 5: run · 1 of 1 slices shown (4 of 18)]
[im 1/1]
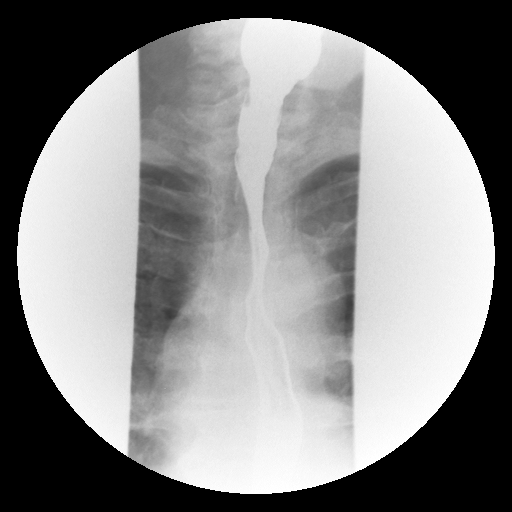

[Series 6: run · 1 of 1 slices shown (5 of 18)]
[im 1/1]
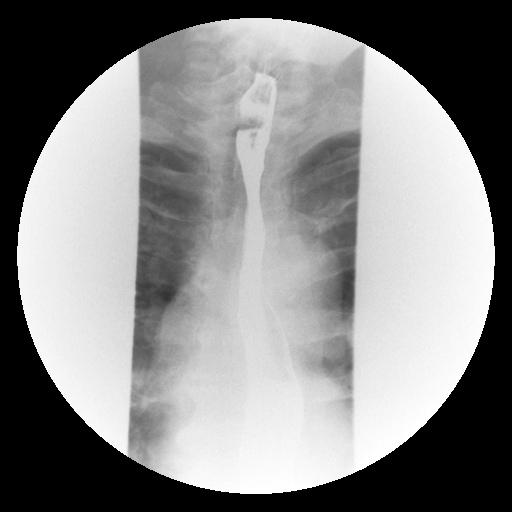

[Series 8: run · 1 of 1 slices shown (6 of 18)]
[im 1/1]
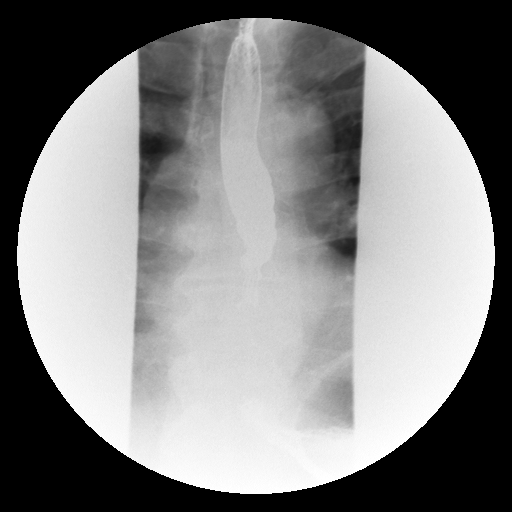

[Series 9: run · 1 of 1 slices shown (7 of 18)]
[im 1/1]
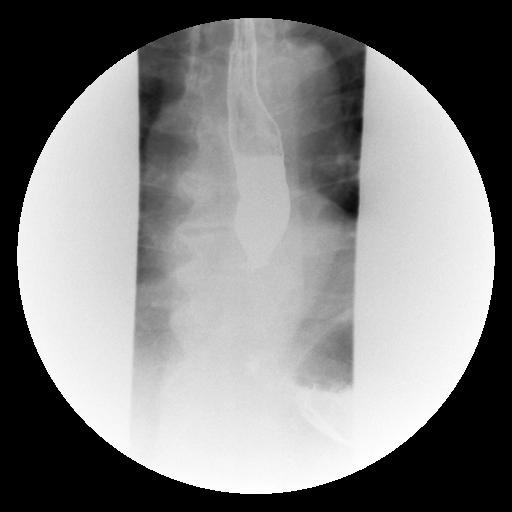

[Series 10: run · 1 of 1 slices shown (8 of 18)]
[im 1/1]
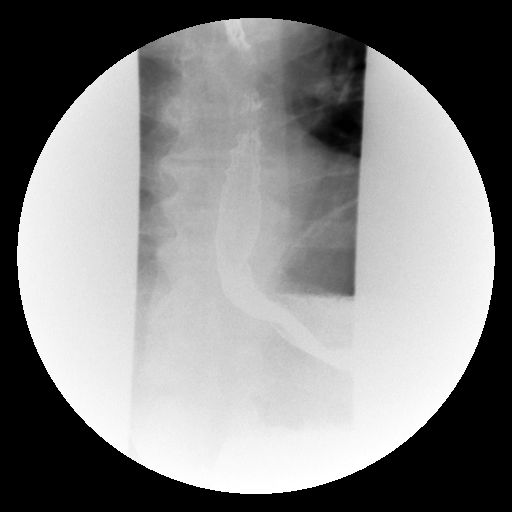

[Series 11: run · 1 of 1 slices shown (9 of 18)]
[im 1/1]
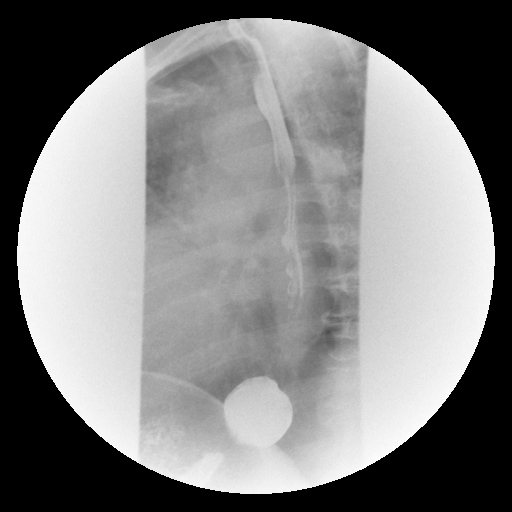

[Series 12: run · 1 of 1 slices shown (10 of 18)]
[im 1/1]
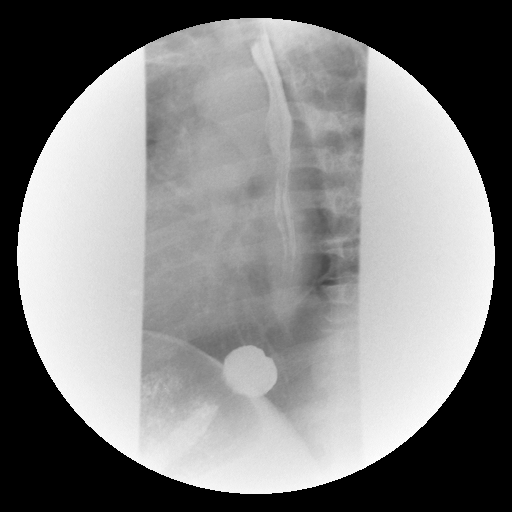

[Series 14: run · 1 of 1 slices shown (11 of 18)]
[im 1/1]
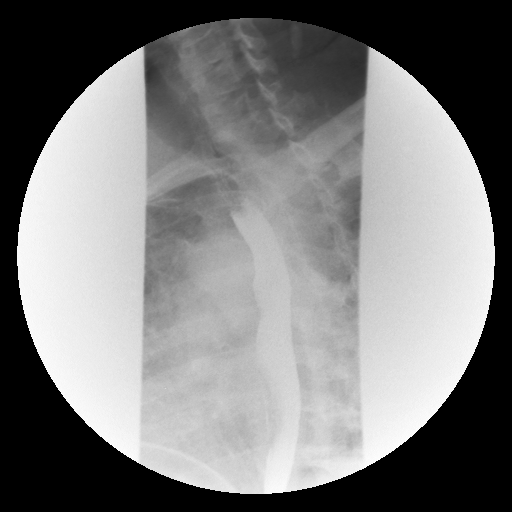

[Series 15: run · 1 of 1 slices shown (12 of 18)]
[im 1/1]
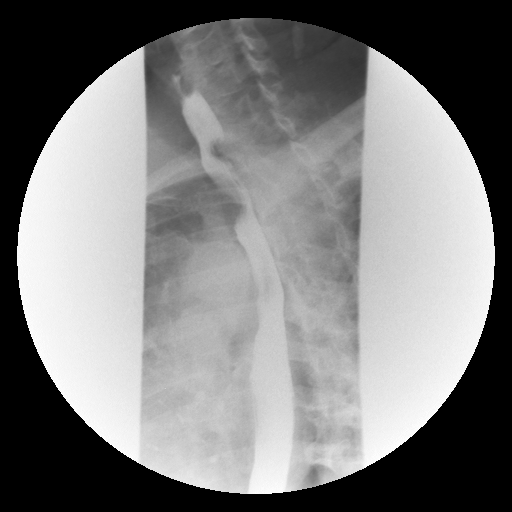

[Series 16: run · 1 of 1 slices shown (13 of 18)]
[im 1/1]
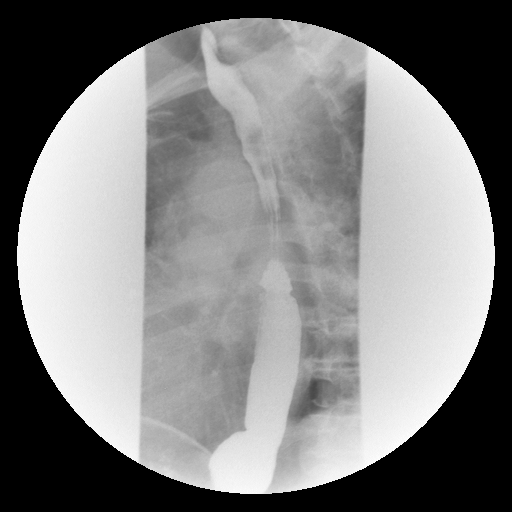

[Series 17: run · 1 of 1 slices shown (14 of 18)]
[im 1/1]
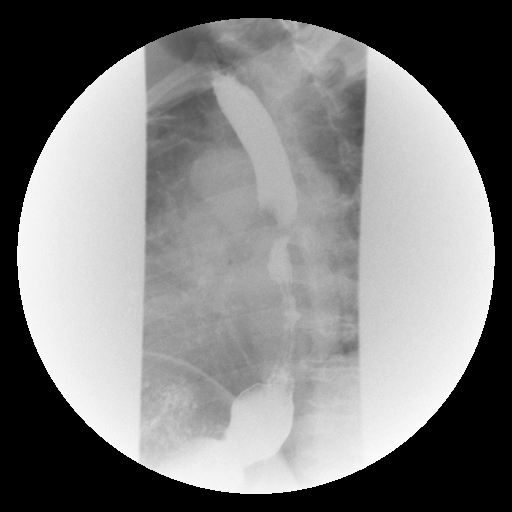

[Series 18: run · 1 of 1 slices shown (15 of 18)]
[im 1/1]
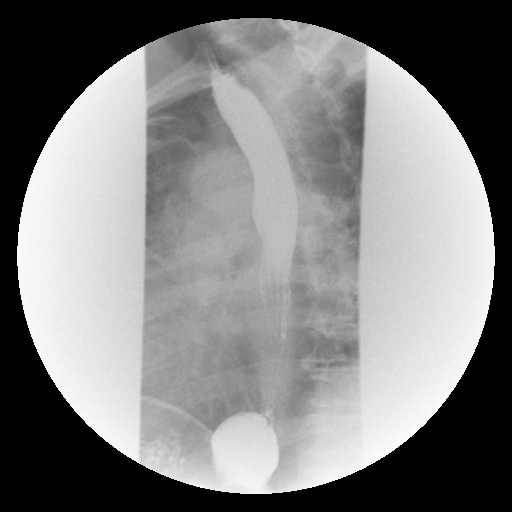

[Series 20: run · 1 of 1 slices shown (16 of 18)]
[im 1/1]
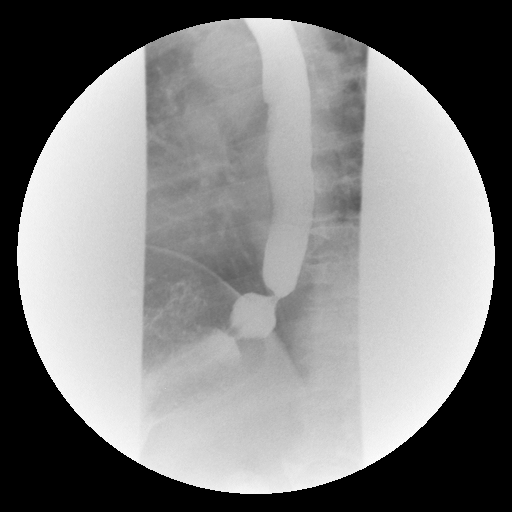

[Series 21: run · 1 of 1 slices shown (17 of 18)]
[im 1/1]
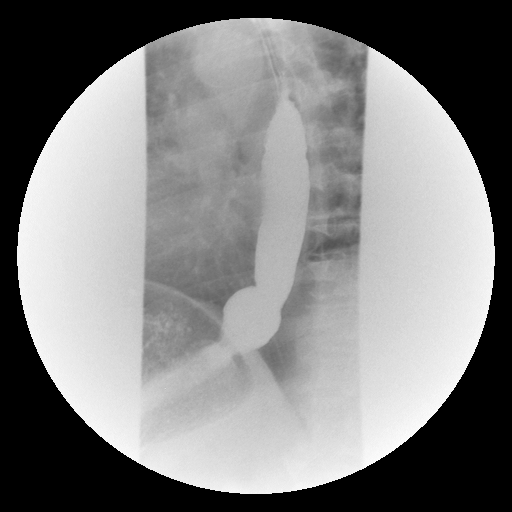

[Series 22: run · 1 of 1 slices shown (18 of 18)]
[im 1/1]
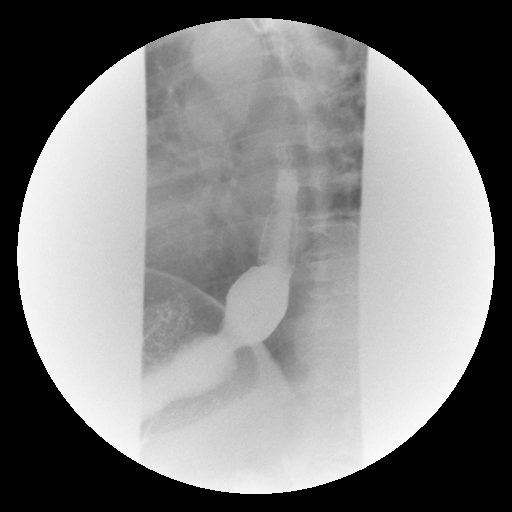

[20 of 24 positions shown; findings below may reference images not displayed]

FINDINGS: Swallowing mechanism is normal.  Esophageal motility is
occasionally sluggish.  No esophageal fold thickening, stricture or
obstruction.  Small hiatal hernia.  A 13 mm barium pill passed into
the stomach without difficulty.
IMPRESSION: 1.  Occasionally sluggish esophageal motility.
2.  Small hiatal hernia.

## 2015-10-27 ENCOUNTER — Other Ambulatory Visit: Payer: Self-pay | Admitting: Gastroenterology

## 2015-11-14 ENCOUNTER — Encounter (HOSPITAL_COMMUNITY): Payer: Self-pay | Admitting: *Deleted

## 2015-11-17 ENCOUNTER — Encounter (HOSPITAL_COMMUNITY): Payer: Self-pay | Admitting: *Deleted

## 2015-11-21 NOTE — Anesthesia Preprocedure Evaluation (Addendum)
Anesthesia Evaluation  Patient identified by MRN, date of birth, ID band Patient awake    Reviewed: Allergy & Precautions, NPO status , Patient's Chart, lab work & pertinent test results, reviewed documented beta blocker date and time   History of Anesthesia Complications Negative for: history of anesthetic complications  Airway Mallampati: II  TM Distance: >3 FB Neck ROM: Full    Dental  (+) Partial Upper, Partial Lower, Dental Advisory Given   Pulmonary neg pulmonary ROS, neg shortness of breath, neg sleep apnea, neg COPD, neg recent URI,    Pulmonary exam normal breath sounds clear to auscultation       Cardiovascular hypertension, Pt. on medications (-) angina(-) CAD, (-) Past MI, (-) Cardiac Stents and (-) CABG  Rhythm:Regular Rate:Normal  LHC 11/05/2012: IMPRESSIONS:  1. Widely patent left main coronary artery. 2. Widely patent  left anterior descending artery and its branches.  Mild disease in the mid LAD at the first diagonal.   3. Widely patent  left circumflex artery and its branches. 4. Widely patent right coronary artery. 5. Normal left ventricular systolic function.  LVEDP 14 mmHg.  Ejection fraction 60%. 6.  It was difficult to engage the coronary arteries from the right radial approach 22 tortuosity in the right subclavian.  If he required an emergency procedure in the future, I would not use a right radial approach.   Neuro/Psych neg Seizures negative neurological ROS     GI/Hepatic Neg liver ROS, GERD  Medicated,  Endo/Other  negative endocrine ROS  Renal/GU negative Renal ROS     Musculoskeletal  (+) Arthritis ,   Abdominal (+) + obese,   Peds  Hematology negative hematology ROS (+)   Anesthesia Other Findings H/o melanoma, HLD, central serous choroidopathy  Reproductive/Obstetrics                            Anesthesia Physical Anesthesia Plan  ASA: II  Anesthesia  Plan: MAC   Post-op Pain Management:    Induction: Intravenous  Airway Management Planned: Natural Airway and Nasal Cannula  Additional Equipment:   Intra-op Plan:   Post-operative Plan:   Informed Consent: I have reviewed the patients History and Physical, chart, labs and discussed the procedure including the risks, benefits and alternatives for the proposed anesthesia with the patient or authorized representative who has indicated his/her understanding and acceptance.   Dental advisory given  Plan Discussed with: CRNA  Anesthesia Plan Comments:        Anesthesia Quick Evaluation

## 2015-11-22 ENCOUNTER — Encounter (HOSPITAL_COMMUNITY): Payer: Self-pay | Admitting: *Deleted

## 2015-11-22 ENCOUNTER — Encounter (HOSPITAL_COMMUNITY)
Admission: RE | Disposition: A | Discharge status: Home / Self Care | Payer: Self-pay | Source: Ambulatory Visit | Attending: Gastroenterology

## 2015-11-22 ENCOUNTER — Ambulatory Visit (HOSPITAL_COMMUNITY): Payer: Medicare Other | Admitting: Anesthesiology

## 2015-11-22 ENCOUNTER — Ambulatory Visit (HOSPITAL_COMMUNITY)
Admission: RE | Admit: 2015-11-22 | Discharge: 2015-11-22 | Disposition: A | Discharge status: Home / Self Care | Payer: Medicare Other | Source: Ambulatory Visit | Attending: Gastroenterology | Admitting: Gastroenterology

## 2015-11-22 DIAGNOSIS — K219 Gastro-esophageal reflux disease without esophagitis: Secondary | ICD-10-CM | POA: Insufficient documentation

## 2015-11-22 DIAGNOSIS — I10 Essential (primary) hypertension: Secondary | ICD-10-CM | POA: Insufficient documentation

## 2015-11-22 DIAGNOSIS — Z96652 Presence of left artificial knee joint: Secondary | ICD-10-CM | POA: Diagnosis not present

## 2015-11-22 DIAGNOSIS — M069 Rheumatoid arthritis, unspecified: Secondary | ICD-10-CM | POA: Diagnosis not present

## 2015-11-22 DIAGNOSIS — Z1211 Encounter for screening for malignant neoplasm of colon: Secondary | ICD-10-CM | POA: Diagnosis not present

## 2015-11-22 DIAGNOSIS — E669 Obesity, unspecified: Secondary | ICD-10-CM | POA: Diagnosis not present

## 2015-11-22 DIAGNOSIS — Z79899 Other long term (current) drug therapy: Secondary | ICD-10-CM | POA: Diagnosis not present

## 2015-11-22 DIAGNOSIS — Z7982 Long term (current) use of aspirin: Secondary | ICD-10-CM | POA: Insufficient documentation

## 2015-11-22 DIAGNOSIS — Z6834 Body mass index (BMI) 34.0-34.9, adult: Secondary | ICD-10-CM | POA: Insufficient documentation

## 2015-11-22 DIAGNOSIS — D123 Benign neoplasm of transverse colon: Secondary | ICD-10-CM | POA: Diagnosis not present

## 2015-11-22 DIAGNOSIS — Z86010 Personal history of colonic polyps: Secondary | ICD-10-CM | POA: Diagnosis not present

## 2015-11-22 DIAGNOSIS — Z85820 Personal history of malignant melanoma of skin: Secondary | ICD-10-CM | POA: Diagnosis not present

## 2015-11-22 HISTORY — DX: Gastro-esophageal reflux disease without esophagitis: K21.9

## 2015-11-22 HISTORY — DX: Malignant (primary) neoplasm, unspecified: C80.1

## 2015-11-22 HISTORY — PX: COLONOSCOPY WITH PROPOFOL: SHX5780

## 2015-11-22 SURGERY — COLONOSCOPY WITH PROPOFOL
Anesthesia: Monitor Anesthesia Care

## 2015-11-22 MED ORDER — LACTATED RINGERS IV SOLN
INTRAVENOUS | Status: DC
  Administered 2015-11-22: 10:00:00 via INTRAVENOUS

## 2015-11-22 MED ORDER — LIDOCAINE HCL (CARDIAC) 20 MG/ML IV SOLN
INTRAVENOUS | Status: DC | PRN
  Administered 2015-11-22: 100 mg via INTRAVENOUS

## 2015-11-22 MED ORDER — SODIUM CHLORIDE 0.9 % IV SOLN: INTRAVENOUS | Status: DC

## 2015-11-22 MED ORDER — PROPOFOL 10 MG/ML IV BOLUS
INTRAVENOUS | Status: DC | PRN
  Administered 2015-11-22: 20 mg via INTRAVENOUS
  Administered 2015-11-22: 30 mg via INTRAVENOUS
  Administered 2015-11-22: 20 mg via INTRAVENOUS
  Administered 2015-11-22: 30 mg via INTRAVENOUS

## 2015-11-22 MED ORDER — LIDOCAINE HCL (CARDIAC) 20 MG/ML IV SOLN
INTRAVENOUS | Status: AC
  Filled 2015-11-22: qty 5

## 2015-11-22 MED ORDER — ONDANSETRON HCL 4 MG/2ML IJ SOLN
INTRAMUSCULAR | Status: DC | PRN
  Administered 2015-11-22: 4 mg via INTRAVENOUS

## 2015-11-22 MED ORDER — PROPOFOL 10 MG/ML IV BOLUS
INTRAVENOUS | Status: AC
  Filled 2015-11-22: qty 60

## 2015-11-22 MED ORDER — ONDANSETRON HCL 4 MG/2ML IJ SOLN
INTRAMUSCULAR | Status: AC
  Filled 2015-11-22: qty 2

## 2015-11-22 MED ORDER — PROPOFOL 500 MG/50ML IV EMUL
INTRAVENOUS | Status: DC | PRN
  Administered 2015-11-22: 100 ug/kg/min via INTRAVENOUS

## 2015-11-22 SURGICAL SUPPLY — 22 items

## 2015-11-22 NOTE — Op Note (Signed)
[facility] Patient Name: Marcus Peterson Procedure Date: 11/22/2015 MRN: [id number] Attending MD: Garlan Fair , MD Date of Birth: 11/23/1950 CSN: [id number] Age: 65 Admit Type: Outpatient Procedure:                Colonoscopy Indications:              High risk colon cancer surveillance: Personal                            history of non-advanced adenoma Providers:                Garlan Fair, MD, Laverta Baltimore RN, RN,                            Despina Pole Tech, Technician, Eligha Bridegroom CRNA,                            CRNA, Adair Laundry, CRNA Referring MD:              Medicines:                Propofol per Anesthesia Complications:            No immediate complications. Estimated Blood Loss:     Estimated blood loss: none. Procedure:                Pre-Anesthesia Assessment:                           - Prior to the procedure, a History and Physical                            was performed, and patient medications and                            allergies were reviewed. The patient's tolerance of                            previous anesthesia was also reviewed. The risks                            and benefits of the procedure and the sedation                            options and risks were discussed with the patient.                            All questions were answered, and informed consent                            was obtained. Prior Anticoagulants: The patient has                            taken aspirin, last dose was 1 day prior to  procedure. ASA Grade Assessment: III - A patient                            with severe systemic disease. After reviewing the                            risks and benefits, the patient was deemed in                            satisfactory condition to undergo the procedure.                           After obtaining informed consent, the colonoscope                            was passed  under direct vision. Throughout the                            procedure, the patient's blood pressure, pulse, and                            oxygen saturations were monitored continuously. The                            EC-[id number]) scope was introduced through                            the anus and advanced to the the cecum, identified                            by appendiceal orifice and ileocecal valve. The                            colonoscopy was performed without difficulty. The                            patient tolerated the procedure well. The quality                            of the bowel preparation was good. The ileocecal                            valve, the appendiceal orifice and the rectum were                            photographed. Scope In: 11:20:19 AM Scope Out: 11:39:00 AM Scope Withdrawal Time: 0 hours 11 minutes 6 seconds  Total Procedure Duration: 0 hours 18 minutes 41 seconds  Findings:      The perianal and digital rectal examinations were normal.      A 3 mm polyp was found in the distal transverse colon. The polyp was       sessile. The polyp was removed with a cold biopsy forceps. Resection and       retrieval were complete.  The exam was otherwise without abnormality. Impression:               - One 3 mm polyp in the distal transverse colon,                            removed with a cold biopsy forceps. Resected and                            retrieved.                           - The examination was otherwise normal. Moderate Sedation:      N/A- Per Anesthesia Care Recommendation:           - Patient has a contact number available for                            emergencies. The signs and symptoms of potential                            delayed complications were discussed with the                            patient. Return to normal activities tomorrow.                            Written discharge instructions were provided to the                             patient.                           - Repeat colonoscopy in 5 years for surveillance.                           - Resume previous diet.                           - Continue present medications. Procedure Code(s):        --- Professional ---                           [phone], Colonoscopy, flexible; with biopsy, single                            or multiple Diagnosis Code(s):        --- Professional ---                           Z86.010, Personal history of colonic polyps                           D12.3, Benign neoplasm of transverse colon (hepatic                            flexure or splenic flexure) CPT copyright 2016 [employer].  All rights reserved. The codes documented in this report are preliminary and upon coder review may  be revised to meet current compliance requirements. Earle Gell, MD Garlan Fair, MD 11/22/2015 11:45:19 AM This report has been signed electronically. Number of Addenda: 0

## 2015-11-22 NOTE — Anesthesia Postprocedure Evaluation (Signed)
Anesthesia Post Note  [name]  Procedure(s) Performed: Procedure(s) (LRB): COLONOSCOPY WITH PROPOFOL (N/A)  Patient location during evaluation: PACU Anesthesia Type: MAC Level of consciousness: awake and alert Pain management: pain level controlled Vital Signs Assessment: post-procedure vital signs reviewed and stable Respiratory status: spontaneous breathing, nonlabored ventilation and respiratory function stable Cardiovascular status: stable and blood pressure returned to baseline Anesthetic complications: no    Last Vitals:  Vitals:   11/22/15 1210 11/22/15 1220  BP: 134/74 124/72  Pulse: (!) 44 (!) 45  Resp: 13 14  Temp:      Last Pain:  Vitals:   11/22/15 1148  TempSrc: Oral                 Nilda Simmer

## 2015-11-22 NOTE — Transfer of Care (Signed)
Immediate Anesthesia Transfer of Care Note  [name]  Procedure(s) Performed: Procedure(s): COLONOSCOPY WITH PROPOFOL (N/A)  Patient Location: ENDO  Anesthesia Type:MAC  Level of Consciousness:  sedated, patient cooperative and responds to stimulation  Airway & Oxygen Therapy:Patient Spontanous Breathing and Patient connected to face mask oxgen  Post-op Assessment:  Report given to ENDO RN and Post -op Vital signs reviewed and stable  Post vital signs:  Reviewed and stable  Last Vitals:  Vitals:   11/22/15 1010  BP: (!) 166/73  Pulse: 72  Resp: 19  Temp: 123XX123 C    Complications: No apparent anesthesia complications

## 2015-11-22 NOTE — Discharge Instructions (Signed)

## 2015-11-22 NOTE — H&P (Signed)
  Procedure: Surveillance colonoscopy. 06/27/2010 colonoscopy with removal of two small adenomatous colon polyps was performed  History: The patient is a 65 year old male born 11-17-1950 atrial to undergo a surveillance colonoscopy today.  Past medical history: Rheumatoid arthritis. Hypertension. Hypercholesterolemia. Tonsillectomy. Arthroscopic right knee surgery. Lumbar back surgery. Total left knee was placed surgery. Right inguinal herniorrhaphy.  Exam: The patient is alert and lying comfortably on the endoscopy stretcher. Abdomen is soft and nontender to palpation. Lungs are clear to auscultation. Cardiac exam reveals a regular rhythm.  Plan: Proceed with surveillance colonoscopy

## 2015-11-23 ENCOUNTER — Encounter (HOSPITAL_COMMUNITY): Payer: Self-pay | Admitting: Gastroenterology

## 2018-12-03 ENCOUNTER — Ambulatory Visit (HOSPITAL_COMMUNITY): Admit: 2018-12-03 | Payer: Medicare Other | Admitting: Cardiovascular Disease

## 2018-12-03 ENCOUNTER — Inpatient Hospital Stay (HOSPITAL_COMMUNITY)
Admission: EM | Admit: 2018-12-03 | Discharge: 2018-12-05 | DRG: 247 | Disposition: A | Discharge status: Home / Self Care | Payer: Medicare Other | Attending: Cardiovascular Disease | Admitting: Cardiovascular Disease

## 2018-12-03 ENCOUNTER — Emergency Department (HOSPITAL_COMMUNITY): Payer: Medicare Other

## 2018-12-03 ENCOUNTER — Encounter (HOSPITAL_COMMUNITY): Payer: Self-pay

## 2018-12-03 ENCOUNTER — Other Ambulatory Visit: Payer: Self-pay

## 2018-12-03 ENCOUNTER — Inpatient Hospital Stay (HOSPITAL_COMMUNITY)
Admission: EM | Disposition: A | Discharge status: Home / Self Care | Payer: Medicare Other | Source: Home / Self Care | Attending: Cardiovascular Disease

## 2018-12-03 DIAGNOSIS — I2510 Atherosclerotic heart disease of native coronary artery without angina pectoris: Secondary | ICD-10-CM | POA: Diagnosis present

## 2018-12-03 DIAGNOSIS — Z20828 Contact with and (suspected) exposure to other viral communicable diseases: Secondary | ICD-10-CM | POA: Diagnosis present

## 2018-12-03 DIAGNOSIS — K219 Gastro-esophageal reflux disease without esophagitis: Secondary | ICD-10-CM | POA: Diagnosis present

## 2018-12-03 DIAGNOSIS — I2121 ST elevation (STEMI) myocardial infarction involving left circumflex coronary artery: Principal | ICD-10-CM | POA: Diagnosis present

## 2018-12-03 DIAGNOSIS — Z955 Presence of coronary angioplasty implant and graft: Secondary | ICD-10-CM

## 2018-12-03 DIAGNOSIS — I219 Acute myocardial infarction, unspecified: Secondary | ICD-10-CM

## 2018-12-03 DIAGNOSIS — Z981 Arthrodesis status: Secondary | ICD-10-CM

## 2018-12-03 DIAGNOSIS — Z79899 Other long term (current) drug therapy: Secondary | ICD-10-CM

## 2018-12-03 DIAGNOSIS — I10 Essential (primary) hypertension: Secondary | ICD-10-CM | POA: Diagnosis present

## 2018-12-03 DIAGNOSIS — E782 Mixed hyperlipidemia: Secondary | ICD-10-CM | POA: Diagnosis present

## 2018-12-03 DIAGNOSIS — I213 ST elevation (STEMI) myocardial infarction of unspecified site: Secondary | ICD-10-CM

## 2018-12-03 DIAGNOSIS — Z8249 Family history of ischemic heart disease and other diseases of the circulatory system: Secondary | ICD-10-CM

## 2018-12-03 DIAGNOSIS — Z96652 Presence of left artificial knee joint: Secondary | ICD-10-CM | POA: Diagnosis present

## 2018-12-03 DIAGNOSIS — R079 Chest pain, unspecified: Secondary | ICD-10-CM | POA: Diagnosis present

## 2018-12-03 DIAGNOSIS — I2119 ST elevation (STEMI) myocardial infarction involving other coronary artery of inferior wall: Secondary | ICD-10-CM | POA: Diagnosis present

## 2018-12-03 DIAGNOSIS — Z85820 Personal history of malignant melanoma of skin: Secondary | ICD-10-CM

## 2018-12-03 DIAGNOSIS — Z7982 Long term (current) use of aspirin: Secondary | ICD-10-CM | POA: Diagnosis not present

## 2018-12-03 HISTORY — PX: LEFT HEART CATH AND CORONARY ANGIOGRAPHY: CATH118249

## 2018-12-03 HISTORY — PX: CORONARY/GRAFT ACUTE MI REVASCULARIZATION: CATH118305

## 2018-12-03 HISTORY — DX: Acute myocardial infarction, unspecified: I21.9

## 2018-12-03 HISTORY — DX: Atherosclerotic heart disease of native coronary artery without angina pectoris: I25.10

## 2018-12-03 HISTORY — DX: Rheumatic fever without heart involvement: I00

## 2018-12-03 LAB — CBC
HCT: 43.9 % (ref 39.0–52.0)
HCT: 41.3 % (ref 39.0–52.0)
Hemoglobin: 14.9 g/dL (ref 13.0–17.0)
Hemoglobin: 14 g/dL (ref 13.0–17.0)
MCH: 33.2 pg (ref 26.0–34.0)
MCH: 32.8 pg (ref 26.0–34.0)
MCHC: 33.9 g/dL (ref 30.0–36.0)
MCHC: 33.9 g/dL (ref 30.0–36.0)
MCV: 97.8 fL (ref 80.0–100.0)
MCV: 96.7 fL (ref 80.0–100.0)
Platelets: 340 10*3/uL (ref 150–400)
Platelets: 365 10*3/uL (ref 150–400)
RBC: 4.49 MIL/uL (ref 4.22–5.81)
RBC: 4.27 MIL/uL (ref 4.22–5.81)
RDW: 15.3 % (ref 11.5–15.5)
RDW: 15.1 % (ref 11.5–15.5)
WBC: 10.6 10*3/uL — ABNORMAL HIGH (ref 4.0–10.5)
WBC: 11 10*3/uL — ABNORMAL HIGH (ref 4.0–10.5)
nRBC: 0 % (ref 0.0–0.2)
nRBC: 0 % (ref 0.0–0.2)

## 2018-12-03 LAB — CREATININE, SERUM
Creatinine, Ser: 0.91 mg/dL (ref 0.61–1.24)
GFR calc Af Amer: >60 mL/min (ref >60)
GFR calc non Af Amer: >60 mL/min (ref >60)

## 2018-12-03 LAB — BASIC METABOLIC PANEL
Anion gap: 12 (ref 5–15)
BUN: 15 mg/dL (ref 8–23)
CO2: 18 mmol/L — ABNORMAL LOW (ref 22–32)
Calcium: 9.5 mg/dL (ref 8.9–10.3)
Chloride: 104 mmol/L (ref 98–111)
Creatinine, Ser: 0.89 mg/dL (ref 0.61–1.24)
GFR calc Af Amer: >60 mL/min (ref >60)
GFR calc non Af Amer: >60 mL/min (ref >60)
Glucose, Bld: 99 mg/dL (ref 70–99)
Potassium: 4.2 mmol/L (ref 3.5–5.1)
Sodium: 134 mmol/L — ABNORMAL LOW (ref 135–145)

## 2018-12-03 LAB — POCT I-STAT, CHEM 8
BUN: 15 mg/dL (ref 8–23)
Calcium, Ion: 1.19 mmol/L (ref 1.15–1.40)
Chloride: 107 mmol/L (ref 98–111)
Creatinine, Ser: 0.8 mg/dL (ref 0.61–1.24)
Glucose, Bld: 98 mg/dL (ref 70–99)
HCT: 41 % (ref 39.0–52.0)
Hemoglobin: 13.9 g/dL (ref 13.0–17.0)
Potassium: 3.9 mmol/L (ref 3.5–5.1)
Sodium: 136 mmol/L (ref 135–145)
TCO2: 18 mmol/L — ABNORMAL LOW (ref 22–32)

## 2018-12-03 LAB — MRSA PCR SCREENING: MRSA by PCR: NEGATIVE

## 2018-12-03 LAB — POCT ACTIVATED CLOTTING TIME
Activated Clotting Time: 258 seconds
Activated Clotting Time: 279 seconds

## 2018-12-03 LAB — TROPONIN I (HIGH SENSITIVITY)
Troponin I (High Sensitivity): 2163 ng/L (ref <18)
Troponin I (High Sensitivity): 3248 ng/L (ref <18)
Troponin I (High Sensitivity): 3855 ng/L (ref <18)

## 2018-12-03 LAB — SARS CORONAVIRUS 2 BY RT PCR (HOSPITAL ORDER, PERFORMED IN ~~LOC~~ HOSPITAL LAB): SARS Coronavirus 2: NEGATIVE

## 2018-12-03 SURGERY — CORONARY/GRAFT ACUTE MI REVASCULARIZATION
Anesthesia: LOCAL

## 2018-12-03 MED ORDER — HYDRALAZINE HCL 20 MG/ML IJ SOLN: 10.0000 mg | INTRAMUSCULAR | Status: AC | PRN

## 2018-12-03 MED ORDER — HEPARIN (PORCINE) IN NACL 1000-0.9 UT/500ML-% IV SOLN
INTRAVENOUS | Status: DC | PRN
  Administered 2018-12-03: 500 mL

## 2018-12-03 MED ORDER — METOPROLOL SUCCINATE ER 50 MG PO TB24
50.0000 mg | ORAL_TABLET | Freq: Every day | ORAL | Status: DC
  Administered 2018-12-04 – 2018-12-05 (×2): 50 mg via ORAL
  Filled 2018-12-03 (×2): qty 1

## 2018-12-03 MED ORDER — LABETALOL HCL 5 MG/ML IV SOLN: 10.0000 mg | INTRAVENOUS | Status: AC | PRN

## 2018-12-03 MED ORDER — SODIUM CHLORIDE 0.9 % IV SOLN
INTRAVENOUS | Status: AC | PRN
  Administered 2018-12-03: 500 mL
  Administered 2018-12-03: 300 mL/h via INTRAVENOUS

## 2018-12-03 MED ORDER — VERAPAMIL HCL 2.5 MG/ML IV SOLN
INTRAVENOUS | Status: DC | PRN
  Administered 2018-12-03: 10 mL via INTRA_ARTERIAL

## 2018-12-03 MED ORDER — NITROGLYCERIN 0.4 MG SL SUBL
0.4000 mg | SUBLINGUAL_TABLET | SUBLINGUAL | Status: DC | PRN
  Administered 2018-12-04 (×2): 0.4 mg via SUBLINGUAL
  Filled 2018-12-03 (×2): qty 1

## 2018-12-03 MED ORDER — HEPARIN SODIUM (PORCINE) 5000 UNIT/ML IJ SOLN
5000.0000 [IU] | Freq: Three times a day (TID) | INTRAMUSCULAR | Status: DC
  Administered 2018-12-04 – 2018-12-05 (×4): 5000 [IU] via SUBCUTANEOUS
  Filled 2018-12-03 (×4): qty 1

## 2018-12-03 MED ORDER — IOHEXOL 350 MG/ML SOLN
INTRAVENOUS | Status: DC | PRN
  Administered 2018-12-03: 210 mL via INTRA_ARTERIAL

## 2018-12-03 MED ORDER — ACETAMINOPHEN 325 MG PO TABS
650.0000 mg | ORAL_TABLET | ORAL | Status: DC | PRN
  Administered 2018-12-04: 650 mg via ORAL
  Filled 2018-12-03: qty 2

## 2018-12-03 MED ORDER — TICAGRELOR 90 MG PO TABS
ORAL_TABLET | ORAL | Status: DC | PRN
  Administered 2018-12-03: 180 mg via ORAL

## 2018-12-03 MED ORDER — VERAPAMIL HCL 2.5 MG/ML IV SOLN
INTRAVENOUS | Status: AC
  Filled 2018-12-03: qty 2

## 2018-12-03 MED ORDER — SODIUM CHLORIDE 0.9% FLUSH
3.0000 mL | Freq: Two times a day (BID) | INTRAVENOUS | Status: DC
  Administered 2018-12-04: 3 mL via INTRAVENOUS

## 2018-12-03 MED ORDER — SODIUM CHLORIDE 0.9% FLUSH: 3.0000 mL | INTRAVENOUS | Status: DC | PRN

## 2018-12-03 MED ORDER — TICAGRELOR 90 MG PO TABS
ORAL_TABLET | ORAL | Status: AC
  Filled 2018-12-03: qty 2

## 2018-12-03 MED ORDER — NITROGLYCERIN 1 MG/10 ML FOR IR/CATH LAB
INTRA_ARTERIAL | Status: AC
  Filled 2018-12-03: qty 10

## 2018-12-03 MED ORDER — TICAGRELOR 90 MG PO TABS
90.0000 mg | ORAL_TABLET | Freq: Two times a day (BID) | ORAL | Status: DC
  Administered 2018-12-04 – 2018-12-05 (×3): 90 mg via ORAL
  Filled 2018-12-03 (×4): qty 1

## 2018-12-03 MED ORDER — ONDANSETRON HCL 4 MG/2ML IJ SOLN: 4.0000 mg | Freq: Four times a day (QID) | INTRAMUSCULAR | Status: DC | PRN

## 2018-12-03 MED ORDER — HEPARIN (PORCINE) IN NACL 1000-0.9 UT/500ML-% IV SOLN
INTRAVENOUS | Status: AC
  Filled 2018-12-03: qty 1000

## 2018-12-03 MED ORDER — SODIUM CHLORIDE 0.9 % IV SOLN: 250.0000 mL | INTRAVENOUS | Status: DC | PRN

## 2018-12-03 MED ORDER — TIROFIBAN (AGGRASTAT) BOLUS VIA INFUSION
INTRAVENOUS | Status: DC | PRN
  Administered 2018-12-03: 2500 ug via INTRAVENOUS

## 2018-12-03 MED ORDER — LIDOCAINE HCL (PF) 1 % IJ SOLN
INTRAMUSCULAR | Status: DC | PRN
  Administered 2018-12-03: 2 mL

## 2018-12-03 MED ORDER — SODIUM CHLORIDE 0.9 % WEIGHT BASED INFUSION
1.0000 mL/kg/h | INTRAVENOUS | Status: AC
  Administered 2018-12-03: 1 mL/kg/h via INTRAVENOUS

## 2018-12-03 MED ORDER — LORATADINE 10 MG PO TABS
10.0000 mg | ORAL_TABLET | Freq: Every day | ORAL | Status: DC
  Administered 2018-12-05: 10 mg via ORAL
  Filled 2018-12-03 (×2): qty 1

## 2018-12-03 MED ORDER — ATORVASTATIN CALCIUM 80 MG PO TABS
80.0000 mg | ORAL_TABLET | Freq: Every day | ORAL | Status: DC
  Administered 2018-12-04: 80 mg via ORAL
  Filled 2018-12-03: qty 1

## 2018-12-03 MED ORDER — MIDAZOLAM HCL 2 MG/2ML IJ SOLN
INTRAMUSCULAR | Status: AC
  Filled 2018-12-03: qty 2

## 2018-12-03 MED ORDER — MIDAZOLAM HCL 2 MG/2ML IJ SOLN
INTRAMUSCULAR | Status: DC | PRN
  Administered 2018-12-03: 1 mg via INTRAVENOUS

## 2018-12-03 MED ORDER — VERAPAMIL HCL 2.5 MG/ML IV SOLN
INTRAVENOUS | Status: DC | PRN
  Administered 2018-12-03 (×4): 100 ug via INTRACORONARY

## 2018-12-03 MED ORDER — ASPIRIN EC 81 MG PO TBEC
81.0000 mg | DELAYED_RELEASE_TABLET | Freq: Every day | ORAL | Status: DC
  Administered 2018-12-04 – 2018-12-05 (×2): 81 mg via ORAL
  Filled 2018-12-03 (×2): qty 1

## 2018-12-03 MED ORDER — LIDOCAINE HCL (PF) 1 % IJ SOLN
INTRAMUSCULAR | Status: AC
  Filled 2018-12-03: qty 30

## 2018-12-03 MED ORDER — PANTOPRAZOLE SODIUM 40 MG PO TBEC
40.0000 mg | DELAYED_RELEASE_TABLET | Freq: Every day | ORAL | Status: DC
  Administered 2018-12-04 – 2018-12-05 (×2): 40 mg via ORAL
  Filled 2018-12-03 (×2): qty 1

## 2018-12-03 MED ORDER — TIROFIBAN HCL IN NACL 5-0.9 MG/100ML-% IV SOLN
INTRAVENOUS | Status: AC
  Filled 2018-12-03: qty 100

## 2018-12-03 MED ORDER — SODIUM CHLORIDE 0.9 % WEIGHT BASED INFUSION: 1.0000 mL/kg/h | INTRAVENOUS | Status: DC

## 2018-12-03 MED ORDER — HEPARIN SODIUM (PORCINE) 5000 UNIT/ML IJ SOLN
4000.0000 [IU] | Freq: Once | INTRAMUSCULAR | Status: AC
  Administered 2018-12-03: 4000 [IU] via INTRAVENOUS
  Filled 2018-12-03: qty 0.8

## 2018-12-03 MED ORDER — FENTANYL CITRATE (PF) 100 MCG/2ML IJ SOLN
INTRAMUSCULAR | Status: AC
  Filled 2018-12-03: qty 2

## 2018-12-03 MED ORDER — HEPARIN SODIUM (PORCINE) 1000 UNIT/ML IJ SOLN
INTRAMUSCULAR | Status: DC | PRN
  Administered 2018-12-03: 3000 [IU] via INTRAVENOUS
  Administered 2018-12-03: 5000 [IU] via INTRAVENOUS

## 2018-12-03 MED ORDER — TIROFIBAN HCL IN NACL 5-0.9 MG/100ML-% IV SOLN: INTRAVENOUS | Status: DC | PRN

## 2018-12-03 MED ORDER — FENTANYL CITRATE (PF) 100 MCG/2ML IJ SOLN
INTRAMUSCULAR | Status: DC | PRN
  Administered 2018-12-03: 25 ug via INTRAVENOUS

## 2018-12-03 MED ORDER — OMEPRAZOLE MAGNESIUM 20 MG PO TBEC: 20.0000 mg | DELAYED_RELEASE_TABLET | Freq: Every day | ORAL | Status: DC

## 2018-12-03 SURGICAL SUPPLY — 19 items
BALLN SAPPHIRE 2.0X12 (BALLOONS) ×2
BALLN SAPPHIRE ~~LOC~~ 3.0X15 (BALLOONS) ×1 IMPLANT
BALLOON SAPPHIRE 2.0X12 (BALLOONS) IMPLANT
CATH 5FR JL3.5 JR4 ANG PIG MP (CATHETERS) ×1 IMPLANT
CATH INFINITI 5FR JL4 (CATHETERS) ×1 IMPLANT
CATH LAUNCHER 6FR EBU3.5 (CATHETERS) ×1 IMPLANT
DEVICE RAD COMP TR BAND LRG (VASCULAR PRODUCTS) ×1 IMPLANT
GLIDESHEATH SLEND SS 6F .021 (SHEATH) ×1 IMPLANT
GUIDEWIRE INQWIRE 1.5J.035X260 (WIRE) IMPLANT
INQWIRE 1.5J .035X260CM (WIRE) ×2
KIT ENCORE 26 ADVANTAGE (KITS) ×1 IMPLANT
KIT HEART LEFT (KITS) ×2 IMPLANT
KIT HEMO VALVE WATCHDOG (MISCELLANEOUS) ×1 IMPLANT
PACK CARDIAC CATHETERIZATION (CUSTOM PROCEDURE TRAY) ×2 IMPLANT
STENT SYNERGY DES 2.5X24 (Permanent Stent) ×1 IMPLANT
TRANSDUCER W/STOPCOCK (MISCELLANEOUS) ×2 IMPLANT
TUBING CIL FLEX 10 FLL-RA (TUBING) ×2 IMPLANT
WIRE COUGAR XT STRL 190CM (WIRE) ×2 IMPLANT
WIRE HI TORQ WHISPER MS 190CM (WIRE) ×1 IMPLANT

## 2018-12-03 NOTE — ED Notes (Signed)
4000 unit heparin given

## 2018-12-03 NOTE — H&P (Signed)
Cardiology Admission History and Physical:   Patient ID: Marcus Peterson MRN: [id number]; DOB: November 22, 1950   Admission date: 12/03/2018  Primary Care Provider: Lajean Manes, MD Primary Cardiologist: No primary care provider on file.  Primary Electrophysiologist:  None   Chief Complaint:  Chest pain  Patient Profile:   Marcus Peterson is a 68 y.o. male with no past cardiac history, presenting with chest pain, brought in by [facility] as a code STEMI.  History of Present Illness:   Marcus Peterson is a 68 year old male with hypertension and mixed hyperlipidemia, presenting as a code STEMI via EMS.  The patient has been sick for about 2 weeks now.  2 weeks ago he had a temperature up to 103 degrees with associated cough.  He was tested for COVID-19 and this was negative.  He was apparently very weak and had poor appetite.  Once his COVID-19 test returned negative, he was treated empirically with doxycycline and started to improve within 48 hours.  He was getting stronger until yesterday when he developed increased weakness and chest pain.  He had constant burning in the substernal region overnight into this morning.  He called EMS for further evaluation of chest pain.  The patient did complain of shortness of breath.  He denies diaphoresis, nausea, or vomiting.  His EKG demonstrates findings consistent with possible inferolateral injury current.  Heart Pathway Score:     Past Medical History:  Diagnosis Date  . Arthritis   . Cancer ([place])    melanoma on right arm   . Central serous choroidopathy   . Erectile dysfunction    Situation reaction  . GERD (gastroesophageal reflux disease)   . Hyperlipidemia   . Hypertension   . Nonspecific abnormal unspecified cardiovascular function study   . Right wrist fracture   . Shingles 2010    Past Surgical History:  Procedure Laterality Date  . adenomotous colon polyp N/A 2001 and 2012  . COLONOSCOPY WITH PROPOFOL N/A 11/22/2015   Procedure: COLONOSCOPY WITH PROPOFOL;  Surgeon: Garlan Fair, MD;  Location: [facility] ENDOSCOPY;  Service: Endoscopy;  Laterality: N/A;  . HERNIA REPAIR  01/2011   RIH  . JOINT REPLACEMENT     left knee  . LEFT HEART CATHETERIZATION WITH CORONARY ANGIOGRAM N/A 11/05/2012   Procedure: LEFT HEART CATHETERIZATION WITH CORONARY ANGIOGRAM;  Surgeon: Jettie Booze, MD;  Location: [facility] CATH LAB;  Service: Cardiovascular;  Laterality: N/A;  . left knee replacement Left    contracture, L knee replacement  . s/p arthroscopic surgery right elbow Right   . SPINE SURGERY     lumbar fusion     Medications Prior to Admission: Prior to Admission medications   Medication Sig Start Date End Date Taking? Authorizing Provider  aspirin EC 81 MG tablet Take 81 mg by mouth daily.    [provider]  BENAZEPRIL HCL PO Take 20 mg by mouth daily.    [provider]  Calcium Carbonate-Vitamin D (CALCIUM + D PO) Take 1 tablet by mouth daily.     [provider]  etanercept (ENBREL) 50 MG/ML injection Inject 50 mg into the skin once a week. On Monday    [provider]  folic acid (FOLVITE) 1 MG tablet Take 1 mg by mouth daily.    [provider]  loratadine (CLARITIN) 10 MG tablet Take 10 mg by mouth daily.    [provider]  methotrexate 2.5 MG tablet Take 17.5 mg by mouth once a  week. On saturday    [provider]  metoprolol succinate (TOPROL-XL) 50 MG 24 hr tablet TAKE ONE TABLET BY MOUTH ONE TIME DAILY 04/15/14   Belva Crome, MD  Multiple Vitamin (MULTIVITAMIN) tablet Take 1 tablet by mouth daily.      [provider]  nitroGLYCERIN (NITROSTAT) 0.4 MG SL tablet Place 0.4 mg under the tongue every 5 (five) minutes as needed for chest pain.    [provider]  omeprazole (PRILOSEC OTC) 20 MG tablet Take 20 mg by mouth daily.    [provider]     Allergies:   No Known Allergies  Social History:   Social History    Socioeconomic History  . Marital status: Married    Spouse name: Not on file  . Number of children: Not on file  . Years of education: Not on file  . Highest education level: Not on file  Occupational History  . Not on file  Social Needs  . Financial resource strain: Not on file  . Food insecurity    Worry: Not on file    Inability: Not on file  . Transportation needs    Medical: Not on file    Non-medical: Not on file  Tobacco Use  . Smoking status: Never Smoker  . Smokeless tobacco: Current User    Types: Chew  Substance and Sexual Activity  . Alcohol use: No  . Drug use: No  . Sexual activity: Not on file  Lifestyle  . Physical activity    Days per week: Not on file    Minutes per session: Not on file  . Stress: Not on file  Relationships  . Social Herbalist on phone: Not on file    Gets together: Not on file    Attends religious service: Not on file    Active member of club or organization: Not on file    Attends meetings of clubs or organizations: Not on file    Relationship status: Not on file  . Intimate partner violence    Fear of current or ex partner: Not on file    Emotionally abused: Not on file    Physically abused: Not on file    Forced sexual activity: Not on file  Other Topics Concern  . Not on file  Social History Narrative  . Not on file    Family History:   The patient's family history includes Cancer in his mother.   His father had coronary bypass surgery in his 75s. ROS:  Please see the history of present illness.  All other ROS reviewed and negative.     Physical Exam/Data:   Vitals:   12/03/18 1641 12/03/18 1656  BP: (!) 160/77   Pulse: 86   Resp: (!) 26   Temp: 97.9 F (36.6 C)   TempSrc: Oral   SpO2: 95% 97%   No intake or output data in the 24 hours ending 12/03/18 1808 Last 3 Weights 11/22/2015 11/05/2012 02/08/2011  Weight (lbs) 250 lb 251 lb 251 lb 9.6 oz  Weight (kg) 113.399 kg 113.853 kg 114.125 kg      There is no height or weight on file to calculate BMI.  General: Generally ill-appearing male in mild distress HEENT: normal Lymph: no adenopathy Neck: no JVD Endocrine:  No thryomegaly Vascular: No carotid bruits; FA pulses 2+ bilaterally  Cardiac:  normal S1, S2; RRR; no murmur  Lungs:  clear to auscultation bilaterally, no wheezing, rhonchi or  rales  Abd: soft, nontender, no hepatomegaly  Ext: no edema Musculoskeletal:  No deformities, BUE and BLE strength normal and equal Skin: warm and dry  Neuro:  CNs 2-12 intact, no focal abnormalities noted Psych:  Normal affect    EKG:  The ECG that was done today was personally reviewed and demonstrates normal sinus rhythm with ST elevation consistent with acute lateral injury  Relevant CV Studies: Pending  Laboratory Data:  High Sensitivity Troponin:   Recent Labs  Lab 12/03/18 1638  TROPONINIHS 2,163*      Cardiac EnzymesNo results for input(s): TROPONINI in the last 168 hours. No results for input(s): TROPIPOC in the last 168 hours.  Chemistry Recent Labs  Lab 12/03/18 1638  NA 134*  K 4.2  CL 104  CO2 18*  GLUCOSE 99  BUN 15  CREATININE 0.89  CALCIUM 9.5  GFRNONAA >60  GFRAA >60  ANIONGAP 12    No results for input(s): PROT, ALBUMIN, AST, ALT, ALKPHOS, BILITOT in the last 168 hours. Hematology Recent Labs  Lab 12/03/18 1638  WBC 10.6*  RBC 4.49  HGB 14.9  HCT 43.9  MCV 97.8  MCH 33.2  MCHC 33.9  RDW 15.3  PLT 340   BNPNo results for input(s): BNP, PROBNP in the last 168 hours.  DDimer No results for input(s): DDIMER in the last 168 hours.   Radiology/Studies:  No results found.  Assessment and Plan:   1. Inferolateral STEMI: Borderline EKG changes, but clinical presentation and EKG suggestive of acute STEMI of the inferolateral wall.  His chest pain has just resolved at the time of my evaluation after receiving nitroglycerin.  I have recommended that we proceed emergently to the cardiac  catheterization lab.  The patient had a repeat COVID-19 swab in the emergency room.  He has received aspirin 324 mg and heparin 4000 units in the emergency room.  Further plan/disposition pending his cardiac catheterization result. 2. Mixed hyperlipidemia: We will review his home medicines and make sure that he is on a high intensity statin drug 3. Hypertension: We will review his antihypertensive medicines and tailor his therapy for appropriate MI medical treatment with a beta-blocker and possibly ACE/ARB.  Severity of Illness: The appropriate patient status for this patient is INPATIENT. Inpatient status is judged to be reasonable and necessary in order to provide the required intensity of service to ensure the patient's safety. The patient's presenting symptoms, physical exam findings, and initial radiographic and laboratory data in the context of their chronic comorbidities is felt to place them at high risk for further clinical deterioration. Furthermore, it is not anticipated that the patient will be medically stable for discharge from the hospital within 2 midnights of admission.   * I certify that at the point of admission it is my clinical judgment that the patient will require inpatient hospital care spanning beyond 2 midnights from the point of admission due to high intensity of service, high risk for further deterioration and high frequency of surveillance required.*   For questions or updates, please contact [place] Please consult [email or web address] for contact info under   Signed, Sherren Mocha, MD  12/03/2018 6:08 PM

## 2018-12-03 NOTE — ED Provider Notes (Signed)
[facility] EMERGENCY DEPARTMENT Provider Note   CSN: [id number] Arrival date & time: 12/03/18  1632    History   Chief Complaint No chief complaint on file.   HPI Marcus Peterson is a 68 y.o. male with history of hypertension, hyperlipidemia, and GERD presenting to the ED complaining of substernal chest pressure since last night.  Patient reports he has been feeling generally unwell for the past 2 weeks and has tested negative for cough during this time.  He reports that last night he began having central chest pain that began as a burning sensation and progressed to a pressure-like sensation with associated dyspnea.  He reports the symptoms persisted today so he called his PCP and was told to come to the ED.  Patient took 324 mg aspirin at home prior to EMS arrival and was given nitroglycerin in route with resolution of his chest pain.  He denies any sick contacts, fever, chills, abdominal pain, nausea, vomiting, diarrhea, renal complaints.     The history is provided by the patient and the EMS personnel.    Past Medical History:  Diagnosis Date  . Arthritis   . Cancer ([place])    melanoma on right arm   . Central serous choroidopathy   . Erectile dysfunction    Situation reaction  . GERD (gastroesophageal reflux disease)   . Hyperlipidemia   . Hypertension   . Nonspecific abnormal unspecified cardiovascular function study   . Right wrist fracture   . Shingles 2010    Patient Active Problem List   Diagnosis Date Noted  . Nonspecific abnormal unspecified cardiovascular function study   . Inguinal hernia unilateral, non-recurrent 01/04/2011    Past Surgical History:  Procedure Laterality Date  . adenomotous colon polyp N/A 2001 and 2012  . COLONOSCOPY WITH PROPOFOL N/A 11/22/2015   Procedure: COLONOSCOPY WITH PROPOFOL;  Surgeon: Garlan Fair, MD;  Location: [facility] ENDOSCOPY;  Service: Endoscopy;  Laterality: N/A;  . HERNIA REPAIR  01/2011   RIH  .  JOINT REPLACEMENT     left knee  . LEFT HEART CATHETERIZATION WITH CORONARY ANGIOGRAM N/A 11/05/2012   Procedure: LEFT HEART CATHETERIZATION WITH CORONARY ANGIOGRAM;  Surgeon: Jettie Booze, MD;  Location: [facility] CATH LAB;  Service: Cardiovascular;  Laterality: N/A;  . left knee replacement Left    contracture, L knee replacement  . s/p arthroscopic surgery right elbow Right   . SPINE SURGERY     lumbar fusion        Home Medications    Prior to Admission medications   Medication Sig Start Date End Date Taking? Authorizing Provider  aspirin EC 81 MG tablet Take 81 mg by mouth daily.    [provider]  BENAZEPRIL HCL PO Take 20 mg by mouth daily.    [provider]  Calcium Carbonate-Vitamin D (CALCIUM + D PO) Take 1 tablet by mouth daily.     [provider]  etanercept (ENBREL) 50 MG/ML injection Inject 50 mg into the skin once a week. On Monday    [provider]  folic acid (FOLVITE) 1 MG tablet Take 1 mg by mouth daily.    [provider]  loratadine (CLARITIN) 10 MG tablet Take 10 mg by mouth daily.    [provider]  methotrexate 2.5 MG tablet Take 17.5 mg by mouth once a week. On saturday    [provider]  metoprolol succinate (TOPROL-XL) 50 MG 24 hr tablet TAKE ONE TABLET BY  MOUTH ONE TIME DAILY 04/15/14   Belva Crome, MD  Multiple Vitamin (MULTIVITAMIN) tablet Take 1 tablet by mouth daily.      [provider]  nitroGLYCERIN (NITROSTAT) 0.4 MG SL tablet Place 0.4 mg under the tongue every 5 (five) minutes as needed for chest pain.    [provider]  omeprazole (PRILOSEC OTC) 20 MG tablet Take 20 mg by mouth daily.    [provider]    Family History Family History  Problem Relation Age of Onset  . Cancer Mother        colon    Social History Social History   Tobacco Use  . Smoking status: Never Smoker  . Smokeless tobacco: Current User    Types: Chew  Substance Use  Topics  . Alcohol use: No  . Drug use: No     Allergies   Patient has no known allergies.   Review of Systems Review of Systems  Constitutional: Negative for chills and fever.  HENT: Negative for ear pain and sore throat.   Eyes: Negative for pain and visual disturbance.  Respiratory: Positive for shortness of breath. Negative for cough.   Cardiovascular: Positive for chest pain. Negative for palpitations.  Gastrointestinal: Negative for abdominal pain and vomiting.  Genitourinary: Negative for dysuria and hematuria.  Musculoskeletal: Negative for arthralgias and back pain.  Skin: Negative for color change and rash.  Neurological: Negative for seizures and syncope.  All other systems reviewed and are negative.    Physical Exam Updated Vital Signs BP (!) 160/77   Pulse 86   Temp 97.9 F (36.6 C) (Oral)   Resp (!) 26   SpO2 95%   Physical Exam Vitals signs and nursing note reviewed.  Constitutional:      Appearance: He is obese. He is ill-appearing. He is not diaphoretic.  HENT:     Head: Normocephalic and atraumatic.  Eyes:     Extraocular Movements: Extraocular movements intact.     Conjunctiva/sclera: Conjunctivae normal.  Neck:     Musculoskeletal: Normal range of motion and neck supple. No neck rigidity or muscular tenderness.  Cardiovascular:     Rate and Rhythm: Normal rate and regular rhythm.     Pulses: Normal pulses.     Heart sounds: Normal heart sounds. No murmur. No friction rub. No gallop.   Pulmonary:     Breath sounds: Normal breath sounds. No stridor. No wheezing, rhonchi or rales.     Comments: tachypneic Abdominal:     General: Abdomen is flat. There is no distension.     Palpations: Abdomen is soft.     Tenderness: There is no abdominal tenderness. There is no guarding or rebound.  Musculoskeletal: Normal range of motion.        General: No swelling, tenderness, deformity or signs of injury.  Skin:    General: Skin is warm and dry.   Neurological:     General: No focal deficit present.     Mental Status: He is alert and oriented to person, place, and time. Mental status is at baseline.     Cranial Nerves: No cranial nerve deficit.     Sensory: No sensory deficit.     Motor: No weakness.  Psychiatric:        Mood and Affect: Mood normal.        Behavior: Behavior normal.      ED Treatments / Results  Labs (all labs ordered are listed, but only abnormal results are displayed)  Labs Reviewed  SARS CORONAVIRUS 2 (HOSPITAL ORDER, [place] LAB)  BASIC METABOLIC PANEL  CBC  TROPONIN I (HIGH SENSITIVITY)    EKG None  Radiology No results found.  Procedures Procedures (including critical care time)  Medications Ordered in ED Medications  heparin injection 4,000 Units (has no administration in time range)     Initial Impression / Assessment and Plan / ED Course  I have reviewed the triage vital signs and the nursing notes.  Pertinent labs & imaging results that were available during my care of the patient were reviewed by me and considered in my medical decision making (see chart for details).        Marcus Peterson is a 67 y.o. male with history of hypertension, hyperlipidemia, and GERD presenting to the ED complaining of substernal chest pressure since last night.  Code STEMI was called in the field due to ST elevation in V4, V5, and II.  Repeat EKG on arrival shows inferior and lateral ST elevation in leads II, III, aVF, and V4 through V6.  Cardiology present at bedside on patient's arrival to the ED.  They felt that patient's EKG met STEMI criteria.  The decision was made to take patient to Cath Lab for left heart catheterization and possible PCI.  Patient was taken to the Cath Lab.   Final Clinical Impressions(s) / ED Diagnoses   Final diagnoses:  ST elevation myocardial infarction (STEMI), unspecified artery [facility])    ED Discharge Orders    None       Candie Chroman, MD 12/03/18 1655    Isla Pence, MD 12/03/18 Raye Sorrow    Isla Pence, MD 12/03/18 1943

## 2018-12-04 ENCOUNTER — Encounter (HOSPITAL_COMMUNITY): Payer: Self-pay | Admitting: Cardiovascular Disease

## 2018-12-04 ENCOUNTER — Inpatient Hospital Stay (HOSPITAL_COMMUNITY): Payer: Medicare Other

## 2018-12-04 DIAGNOSIS — I2121 ST elevation (STEMI) myocardial infarction involving left circumflex coronary artery: Secondary | ICD-10-CM

## 2018-12-04 LAB — LIPID PANEL
Cholesterol: 136 mg/dL (ref 0–200)
HDL: 24 mg/dL — ABNORMAL LOW (ref >40)
LDL Cholesterol: 59 mg/dL (ref 0–99)
Total CHOL/HDL Ratio: 5.7 RATIO
Triglycerides: 266 mg/dL — ABNORMAL HIGH (ref <150)
VLDL: 53 mg/dL — ABNORMAL HIGH (ref 0–40)

## 2018-12-04 LAB — BASIC METABOLIC PANEL
Anion gap: 8 (ref 5–15)
BUN: 12 mg/dL (ref 8–23)
CO2: 19 mmol/L — ABNORMAL LOW (ref 22–32)
Calcium: 9 mg/dL (ref 8.9–10.3)
Chloride: 107 mmol/L (ref 98–111)
Creatinine, Ser: 0.79 mg/dL (ref 0.61–1.24)
GFR calc Af Amer: >60 mL/min (ref >60)
GFR calc non Af Amer: >60 mL/min (ref >60)
Glucose, Bld: 88 mg/dL (ref 70–99)
Potassium: 4.5 mmol/L (ref 3.5–5.1)
Sodium: 134 mmol/L — ABNORMAL LOW (ref 135–145)

## 2018-12-04 LAB — CBC
HCT: 39.1 % (ref 39.0–52.0)
Hemoglobin: 13.3 g/dL (ref 13.0–17.0)
MCH: 33.1 pg (ref 26.0–34.0)
MCHC: 34 g/dL (ref 30.0–36.0)
MCV: 97.3 fL (ref 80.0–100.0)
Platelets: 321 10*3/uL (ref 150–400)
RBC: 4.02 MIL/uL — ABNORMAL LOW (ref 4.22–5.81)
RDW: 15.2 % (ref 11.5–15.5)
WBC: 7.7 10*3/uL (ref 4.0–10.5)
nRBC: 0 % (ref 0.0–0.2)

## 2018-12-04 LAB — ECHOCARDIOGRAM COMPLETE
Height: 71 in
Weight: 4000.03 oz

## 2018-12-04 LAB — HIV ANTIBODY (ROUTINE TESTING W REFLEX): HIV Screen 4th Generation wRfx: NONREACTIVE

## 2018-12-04 MED ORDER — IBUPROFEN 100 MG PO CHEW
600.0000 mg | CHEWABLE_TABLET | Freq: Three times a day (TID) | ORAL | Status: DC
  Administered 2018-12-04 (×3): 600 mg via ORAL
  Filled 2018-12-04 (×5): qty 6

## 2018-12-04 MED ORDER — COLCHICINE 0.6 MG PO TABS
0.6000 mg | ORAL_TABLET | Freq: Two times a day (BID) | ORAL | Status: DC
  Administered 2018-12-04 (×2): 0.6 mg via ORAL
  Filled 2018-12-04 (×2): qty 1

## 2018-12-04 MED ORDER — BENAZEPRIL HCL 20 MG PO TABS
20.0000 mg | ORAL_TABLET | Freq: Every day | ORAL | Status: DC
  Administered 2018-12-04 – 2018-12-05 (×2): 20 mg via ORAL
  Filled 2018-12-04 (×2): qty 1

## 2018-12-04 MED FILL — Tirofiban HCl in NaCl 0.9% IV Soln 5 MG/100ML (Base Equiv): INTRAVENOUS | Qty: 100 | Status: AC

## 2018-12-04 MED FILL — Nitroglycerin IV Soln 100 MCG/ML in D5W: INTRA_ARTERIAL | Qty: 10 | Status: AC

## 2018-12-04 NOTE — Plan of Care (Signed)
  Problem: Clinical Measurements: Goal: Ability to maintain clinical measurements within normal limits will improve Outcome: Progressing Goal: Will remain free from infection Outcome: Progressing Goal: Diagnostic test results will improve Outcome: Progressing Goal: Respiratory complications will improve Outcome: Progressing   Problem: Cardiovascular: Goal: Vascular access site(s) Level 0-1 will be maintained Outcome: Progressing   Problem: Cardiac: Goal: Ability to achieve and maintain adequate cardiopulmonary perfusion will improve Outcome: Progressing Goal: Vascular access site(s) Level 0-1 will be maintained Outcome: Progressing

## 2018-12-04 NOTE — Progress Notes (Addendum)
Approx [id number]  Pt with non specific "I don't feel great" complaints, denies Chest Pain/Pressure, states chest feels "hollow", just cannot get comfortable, feels like I can't catch my breath". Up to recliner, gait steady.Verbalized understanding to notify RN immediately with any changes in discomfort or any Chest Pain/Pressure. Callbell in reach.   Morning meds given. Continued to have non specific complaints. EKG monitor shows NSR  w 1AVB 70s with frequent PACs.   5732 12 lead EKG done.   [id number] Dr Angelena Form paged, returned call at 0954.  67 Dr Angelena Form here, reviewing EKGs, in to see pt. No concerns for stent reocclusion at this time, will start advil and colchicine, transfer orders placed this am cancelled.   Pt assisted back to bed, updated on plan, aware of importance of notifying RN immediately with any concerns or Chest Pain/Pressure.

## 2018-12-04 NOTE — Plan of Care (Signed)
Shift Summary:  Vague complaints of chest discomfort, increased with deep respirations, denies CP like on admit. 12 Lead EKG done and reviewed by Cardiologist. Added cochicine and advil for antiinflammatory effects. Transfer orders cancelled for today. Slept during morning, difficulty getting comfortable. Much improved now, ambulated in hallways, ate a little more for dinner.  Cardiac meds per orders. Monitor shows NSR w 1 AVB, slight ST elevation remains post cath (Cards MD aware), frequent PACs this am, improving during shift. BP stable. Echo done, EF 60-65%.   Post STEMI/Cath teaching done. No complications at R radial cath site. Anticipate transfer out in am. Wife at bedside during most of afternoon, participating in education and care.   Problem: Education: Goal: Knowledge of General Education information will improve Description: Including pain rating scale, medication(s)/side effects and non-pharmacologic comfort measures Outcome: Progressing   Problem: Health Behavior/Discharge Planning: Goal: Ability to manage health-related needs will improve Outcome: Progressing   Problem: Clinical Measurements: Goal: Ability to maintain clinical measurements within normal limits will improve Outcome: Progressing Goal: Respiratory complications will improve Outcome: Progressing Goal: Cardiovascular complication will be avoided Outcome: Progressing   Problem: Activity: Goal: Risk for activity intolerance will decrease Outcome: Progressing   Problem: Coping: Goal: Level of anxiety will decrease Outcome: Progressing   Problem: Pain Managment: Goal: General experience of comfort will improve Outcome: Progressing   Problem: Safety: Goal: Ability to remain free from injury will improve Outcome: Progressing   Problem: Education: Goal: Understanding of CV disease, CV risk reduction, and recovery process will improve Outcome: Progressing   Problem: Activity: Goal: Ability to return to  baseline activity level will improve Outcome: Progressing   Problem: Cardiovascular: Goal: Ability to achieve and maintain adequate cardiovascular perfusion will improve Outcome: Progressing Goal: Vascular access site(s) Level 0-1 will be maintained Outcome: Progressing   Problem: Education: Goal: Understanding of cardiac disease, CV risk reduction, and recovery process will improve Outcome: Progressing Goal: Understanding of medication regimen will improve Outcome: Progressing   Problem: Activity: Goal: Ability to tolerate increased activity will improve Outcome: Progressing

## 2018-12-04 NOTE — Progress Notes (Signed)
Progress Note  Patient Name: Marcus Peterson Date of Encounter: 12/04/2018  Primary Cardiologist: Burt Knack  Subjective   No chest pain or dyspnea this morning. He reports one episode of chest pain and nausea last night.   Inpatient Medications    Scheduled Meds: . aspirin EC  81 mg Oral Daily  . atorvastatin  80 mg Oral q1800  . heparin  5,000 Units Subcutaneous Q8H  . loratadine  10 mg Oral Daily  . metoprolol succinate  50 mg Oral Daily  . pantoprazole  40 mg Oral Daily  . sodium chloride flush  3 mL Intravenous Q12H  . ticagrelor  90 mg Oral BID   Continuous Infusions: . sodium chloride     PRN Meds: sodium chloride, acetaminophen, nitroGLYCERIN, ondansetron (ZOFRAN) IV, sodium chloride flush   Vital Signs    Vitals:   12/04/18 0300 12/04/18 0400 12/04/18 0500 12/04/18 0600  BP: 138/78 140/85 124/77 130/78  Pulse: 70 88 83 63  Resp: 17 20 (!) 29 (!) 22  Temp:  97.8 F (36.6 C)    TempSrc:  Oral    SpO2: 96% 96% 90% 97%  Weight:      Height:        Intake/Output Summary (Last 24 hours) at 12/04/2018 0725 Last data filed at 12/04/2018 0658 Gross per 24 hour  Intake 1306.94 ml  Output 1900 ml  Net -593.06 ml   Last 3 Weights 12/03/2018 11/22/2015 11/05/2012  Weight (lbs) 250 lb 250 lb 251 lb  Weight (kg) 113.4 kg 113.399 kg 113.853 kg      Telemetry    sinus - Personally Reviewed  ECG    Sinus, ST elevation lateral and anterolateral leads, unchanged from the EKG immediately post procedure- Personally Reviewed  Physical Exam   GEN: No acute distress.   Neck: No JVD Cardiac: RRR, no murmurs, rubs, or gallops.  Respiratory: Clear to auscultation bilaterally. GI: Soft, nontender, non-distended  MS: No edema; No deformity. Neuro:  Nonfocal  Psych: Normal affect   Labs    High Sensitivity Troponin:   Recent Labs  Lab 12/03/18 1638 12/03/18 1852 12/03/18 2052  TROPONINIHS 2,163* 3,248* 3,855*      Cardiac EnzymesNo results for input(s):  TROPONINI in the last 168 hours. No results for input(s): TROPIPOC in the last 168 hours.   Chemistry Recent Labs  Lab 12/03/18 1638 12/03/18 1704 12/03/18 1852 12/04/18 0216  NA 134* 136  --  134*  K 4.2 3.9  --  4.5  CL 104 107  --  107  CO2 18*  --   --  19*  GLUCOSE 99 98  --  88  BUN 15 15  --  12  CREATININE 0.89 0.80 0.91 0.79  CALCIUM 9.5  --   --  9.0  GFRNONAA >60  --  >60 >60  GFRAA >60  --  >60 >60  ANIONGAP 12  --   --  8     Hematology Recent Labs  Lab 12/03/18 1638 12/03/18 1704 12/03/18 1852 12/04/18 0216  WBC 10.6*  --  11.0* 7.7  RBC 4.49  --  4.27 4.02*  HGB 14.9 13.9 14.0 13.3  HCT 43.9 41.0 41.3 39.1  MCV 97.8  --  96.7 97.3  MCH 33.2  --  32.8 33.1  MCHC 33.9  --  33.9 34.0  RDW 15.3  --  15.1 15.2  PLT 340  --  365 321    BNPNo results for input(s): BNP, PROBNP  in the last 168 hours.   DDimer No results for input(s): DDIMER in the last 168 hours.   Radiology    No results found.  Cardiac Studies   Cardiac cath 12/03/18:  A drug-eluting stent was successfully placed using a STENT SYNERGY DES 2.5X24.  Post intervention, there is a 0% residual stenosis.  A drug-eluting stent was successfully placed using a STENT SYNERGY DES 2.5X24.  Post intervention, there is a 0% residual stenosis.  The left ventricular ejection fraction is 55-65% by visual estimate.  LV end diastolic pressure is normal.  The left ventricular systolic function is normal.   1.  Severe left circumflex/obtuse marginal stenosis treated successfully with PCI using a 2.5 x 24 mm Synergy DES 2.  Moderate proximal LAD stenosis estimated at 50% 3.  Widely patent left main and RCA with no significant stenosis 4.  Normal LV systolic function with normal LVEDP, LVEF is estimated at 55 to 60%  Patient Profile     68 y.o. male with HTN, HLD admitted with an acute lateral STEMI 12/03/18. A drug eluting stent was placed in the Circumflex artery.   Assessment & Plan     1. CAD/Acute lateral STEMI: He is doing well this am. BP stable. A drug eluting stent was placed in the Circumflex yesterday. Non-obstructive disease in the LAD. LV function is normal. Will continue ASA/Brilinta/statin and beta blocker. Will plan echo today. Probable d/c home after echo.   2. HTN: BP stable. Will resume Benazepril 20 mg daily. Continue Toprol  Plan to transfer to telemetry unit today and ambulate. Will not d/c home today. Echo today. Likely d/c home tomorrow.  For questions or updates, please contact [place] Please consult [email or web address] for contact info under        Signed, Lauree Chandler, MD  12/04/2018, 7:25 AM

## 2018-12-04 NOTE — TOC Benefit Eligibility Note (Signed)
Transition of Care [facility]) Benefit Eligibility Note    Patient Details  Name: ASAHEL RISDEN MRN: [id number] Date of Birth: 1950/07/01   Medication/Dose: Colchicine tablet 0.6 mg  :  Dose 0.6 mg  :  Oral  :  2 times daily and Brilinta 90 mg po twice daily.  Covered?: Yes  Tier: 3 Drug  Prescription Coverage Preferred Pharmacy: Any retail Pharmacy  Spoke with Person/Company/Phone Number:: Adele Barthel  Co-Pay: 47.00 for both Meds 30 day supply  Prior Approval: No  Deductible: Unmet(50.00 deductible)   The deductible has been met    Orbie Pyo Phone Number: 12/04/2018, 1:27 PM

## 2018-12-04 NOTE — Progress Notes (Signed)
  Echocardiogram 2D Echocardiogram has been performed.  Jennette Dubin 12/04/2018, 11:35 AM

## 2018-12-04 NOTE — Progress Notes (Signed)
Patient says he has pain in left upper chest. Patient says he can feel it when he breathes. RN administered nitroglycerin SL X 3. Supplemental oxygen increased to 3 Liters. EKG completed. Dr. Elson Areas paged. Dr. Elson Areas stated to give tylenol for pain. Tylenol given as ordered.

## 2018-12-04 NOTE — Progress Notes (Signed)
Called by nursing to see pt due to EKG abnormalities. Early am EKG reviewed on rounds showed continued ST Elevation in the lateral and anterolateral leads. This was unchanged from post PCI EKG. EKG at 9:34 am shows continued ST elevation with PACs and accentuated ST elevation in the premature beats. He describes pleuritic chest pain. No similar pain to yesterday during acute MI which was a pressure across his chest. BP stable. Sinus on tele. Suspect this is post MI pericardial pain. Will start colchicine and NSAIDS.   Lauree Chandler 12/04/2018 10:12 AM

## 2018-12-04 NOTE — Progress Notes (Signed)
1005 Came to see pt who stated not feeling well. C/o not being able to take a deep breath, neck hurting and some chest discomfort. Left educational materials in room. Will continue to follow. Graylon Good RN BSN 12/04/2018 10:28 AM

## 2018-12-04 NOTE — Care Management (Signed)
Per Rey L. With [facility] co-pay amount for Colchicine tabs 0.6 mg twice a day 30 day supply $47.00. Brilinta 75m.twice a day $47.00,for 30 day supply.  No PA required teir 3 medication deductible $50.00 not met. Retail Pharmacies are: [facility].

## 2018-12-05 ENCOUNTER — Encounter (HOSPITAL_COMMUNITY): Payer: Self-pay | Admitting: Physician Assistant

## 2018-12-05 ENCOUNTER — Telehealth: Payer: Self-pay

## 2018-12-05 LAB — BASIC METABOLIC PANEL
Anion gap: 9 (ref 5–15)
BUN: 14 mg/dL (ref 8–23)
CO2: 19 mmol/L — ABNORMAL LOW (ref 22–32)
Calcium: 9.3 mg/dL (ref 8.9–10.3)
Chloride: 106 mmol/L (ref 98–111)
Creatinine, Ser: 0.89 mg/dL (ref 0.61–1.24)
GFR calc Af Amer: >60 mL/min (ref >60)
GFR calc non Af Amer: >60 mL/min (ref >60)
Glucose, Bld: 87 mg/dL (ref 70–99)
Potassium: 4.1 mmol/L (ref 3.5–5.1)
Sodium: 134 mmol/L — ABNORMAL LOW (ref 135–145)

## 2018-12-05 LAB — CBC
HCT: 38.4 % — ABNORMAL LOW (ref 39.0–52.0)
Hemoglobin: 13.2 g/dL (ref 13.0–17.0)
MCH: 33.6 pg (ref 26.0–34.0)
MCHC: 34.4 g/dL (ref 30.0–36.0)
MCV: 97.7 fL (ref 80.0–100.0)
Platelets: 291 10*3/uL (ref 150–400)
RBC: 3.93 MIL/uL — ABNORMAL LOW (ref 4.22–5.81)
RDW: 15.6 % — ABNORMAL HIGH (ref 11.5–15.5)
WBC: 7.7 10*3/uL (ref 4.0–10.5)
nRBC: 0 % (ref 0.0–0.2)

## 2018-12-05 MED ORDER — PANTOPRAZOLE SODIUM 40 MG PO TBEC: 40.0000 mg | DELAYED_RELEASE_TABLET | Freq: Every day | ORAL | 3 refills | Status: DC

## 2018-12-05 MED ORDER — IBUPROFEN 100 MG/5ML PO SUSP
600.0000 mg | Freq: Three times a day (TID) | ORAL | Status: DC
  Filled 2018-12-05: qty 30

## 2018-12-05 MED ORDER — CHLORHEXIDINE GLUCONATE CLOTH 2 % EX PADS
6.0000 | MEDICATED_PAD | Freq: Every day | CUTANEOUS | Status: DC
  Administered 2018-12-05: 6 via TOPICAL

## 2018-12-05 MED ORDER — ATORVASTATIN CALCIUM 80 MG PO TABS: 80.0000 mg | ORAL_TABLET | Freq: Every day | ORAL | 6 refills | Status: DC

## 2018-12-05 MED ORDER — ASPIRIN 81 MG PO TBEC: 81.0000 mg | DELAYED_RELEASE_TABLET | Freq: Every day | ORAL | 11 refills | Status: AC

## 2018-12-05 MED ORDER — TICAGRELOR 90 MG PO TABS: 90.0000 mg | ORAL_TABLET | Freq: Two times a day (BID) | ORAL | 11 refills | Status: DC

## 2018-12-05 MED FILL — BRILINTA 90 MG TABLET: 90 | 30 days supply | Qty: 60 | Fill #0

## 2018-12-05 MED FILL — ASPIRIN LOW DOSE 81 MG TBEC: 81 | 30 days supply | Qty: 30 | Fill #0

## 2018-12-05 MED FILL — PANTOPRAZOLE SOD DR 40 MG T: 40 | 30 days supply | Qty: 30 | Fill #0

## 2018-12-05 MED FILL — ATORVASTATIN CALCIUM 80 MG: 80 | 30 days supply | Qty: 30 | Fill #0

## 2018-12-05 NOTE — Progress Notes (Signed)
Progress Note  Patient Name: Marcus Peterson Date of Encounter: 12/05/2018  Primary Cardiologist: Burt Knack  Subjective   Chest pain improved this am. Only has a mild discomfort under his left shoulder with inspiration. No dyspnea. Ambulating well.   Inpatient Medications    Scheduled Meds: . aspirin EC  81 mg Oral Daily  . atorvastatin  80 mg Oral q1800  . benazepril  20 mg Oral Daily  . colchicine  0.6 mg Oral BID  . heparin  5,000 Units Subcutaneous Q8H  . ibuprofen  600 mg Oral TID  . loratadine  10 mg Oral Daily  . metoprolol succinate  50 mg Oral Daily  . pantoprazole  40 mg Oral Daily  . sodium chloride flush  3 mL Intravenous Q12H  . ticagrelor  90 mg Oral BID   Continuous Infusions: . sodium chloride     PRN Meds: sodium chloride, acetaminophen, nitroGLYCERIN, ondansetron (ZOFRAN) IV, sodium chloride flush   Vital Signs    Vitals:   12/05/18 0300 12/05/18 0400 12/05/18 0500 12/05/18 0600  BP: (!) 99/52 108/67 103/68   Pulse: (!) 57 62 67 80  Resp: 14 (!) 25 15 (!) 25  Temp:      TempSrc:      SpO2: 93% 93% 94% 91%  Weight:      Height:        Intake/Output Summary (Last 24 hours) at 12/05/2018 0742 Last data filed at 12/05/2018 0400 Gross per 24 hour  Intake 840 ml  Output 1400 ml  Net -560 ml   Last 3 Weights 12/03/2018 11/22/2015 11/05/2012  Weight (lbs) 250 lb 250 lb 251 lb  Weight (kg) 113.4 kg 113.399 kg 113.853 kg      Telemetry    sinus - Personally Reviewed  ECG    No AM EKG  Physical Exam   General: Well developed, well nourished, NAD  HEENT: OP clear, mucus membranes moist  SKIN: warm, dry. No rashes. Neuro: No focal deficits  Musculoskeletal: Muscle strength 5/5 all ext  Psychiatric: Mood and affect normal  Neck: No JVD, no carotid bruits, no thyromegaly, no lymphadenopathy.  Lungs:Clear bilaterally, no wheezes, rhonci, crackles Cardiovascular: Regular rate and rhythm. No murmurs, gallops or rubs. Abdomen:Soft. Bowel sounds  present. Non-tender.  Extremities: No lower extremity edema. Pulses are 2 + in the bilateral DP/PT.  Labs    High Sensitivity Troponin:   Recent Labs  Lab 12/03/18 1638 12/03/18 1852 12/03/18 2052  TROPONINIHS 2,163* 3,248* 3,855*      Cardiac EnzymesNo results for input(s): TROPONINI in the last 168 hours. No results for input(s): TROPIPOC in the last 168 hours.   Chemistry Recent Labs  Lab 12/03/18 1638 12/03/18 1704 12/03/18 1852 12/04/18 0216 12/05/18 0358  NA 134* 136  --  134* 134*  K 4.2 3.9  --  4.5 4.1  CL 104 107  --  107 106  CO2 18*  --   --  19* 19*  GLUCOSE 99 98  --  88 87  BUN 15 15  --  12 14  CREATININE 0.89 0.80 0.91 0.79 0.89  CALCIUM 9.5  --   --  9.0 9.3  GFRNONAA >60  --  >60 >60 >60  GFRAA >60  --  >60 >60 >60  ANIONGAP 12  --   --  8 9     Hematology Recent Labs  Lab 12/03/18 1852 12/04/18 0216 12/05/18 0358  WBC 11.0* 7.7 7.7  RBC 4.27 4.02* 3.93*  HGB 14.0 13.3 13.2  HCT 41.3 39.1 38.4*  MCV 96.7 97.3 97.7  MCH 32.8 33.1 33.6  MCHC 33.9 34.0 34.4  RDW 15.1 15.2 15.6*  PLT 365 321 291    BNPNo results for input(s): BNP, PROBNP in the last 168 hours.   DDimer No results for input(s): DDIMER in the last 168 hours.   Radiology    No results found.  Cardiac Studies   Cardiac cath 12/03/18:  A drug-eluting stent was successfully placed using a STENT SYNERGY DES 2.5X24.  Post intervention, there is a 0% residual stenosis.  A drug-eluting stent was successfully placed using a STENT SYNERGY DES 2.5X24.  Post intervention, there is a 0% residual stenosis.  The left ventricular ejection fraction is 55-65% by visual estimate.  LV end diastolic pressure is normal.  The left ventricular systolic function is normal.   1.  Severe left circumflex/obtuse marginal stenosis treated successfully with PCI using a 2.5 x 24 mm Synergy DES 2.  Moderate proximal LAD stenosis estimated at 50% 3.  Widely patent left main and RCA with no  significant stenosis 4.  Normal LV systolic function with normal LVEDP, LVEF is estimated at 55 to 60%  Echo 12/04/18:   1. The left ventricle has normal systolic function with an ejection fraction of 60-65%. The cavity size was normal. Left ventricular diastolic Doppler parameters are consistent with impaired relaxation. Normal wall motion.  2. The right ventricle has normal systolic function. The cavity was normal. There is no increase in right ventricular wall thickness. Right ventricular systolic pressure could not be assessed.  3. The aorta is normal unless otherwise noted.  Patient Profile     68 y.o. male with HTN, HLD admitted with an acute lateral STEMI 12/03/18. A drug eluting stent was placed in the Circumflex artery. LV systolic function normal by echo 12/04/18.   Assessment & Plan    1. CAD/Acute lateral STEMI: Doing well this am. BP stable. Sinus on telemetry. LV systolic function normal by echo 12/04/18. Pleuritic chest pain improved. Continue ASA, Brilinta, statin and beta blocker.    2. HTN: BP controlled.   Discharge home today. Follow up in [facility] office with Dr. Burt Knack.   For questions or updates, please contact [place] Please consult [email or web address] for contact info under      Signed, Lauree Chandler, MD  12/05/2018, 7:42 AM

## 2018-12-05 NOTE — Progress Notes (Signed)
CARDIAC REHAB PHASE I   PRE:  Rate/Rhythm: 77 SR  BP:  Supine:   Sitting: 116/83  Standing:    SaO2: 100%RA  MODE:  Ambulation: 370 ft   POST:  Rate/Rhythm: 97 SR  BP:  Supine:   Sitting: 133/77  Standing:    SaO2: 97%RA 0920-1022 Pt walked 370 ft on RA and tolerated well. MI education completed with pt who voiced understanding. Stressed importance of brilinta with stent. Reviewed NTG use, MI restrictions, heart healthy food choices, walking for ex, CRP 2. Referred to [facility].  Did not refer to virtual program as pt not proficient with Apps.  Encouraged pt to have wife read materials also.   Graylon Good, RN BSN  12/05/2018 10:15 AM

## 2018-12-05 NOTE — Discharge Instructions (Signed)
YOUR CARDIOLOGY TEAM HAS ARRANGED FOR AN E-VISIT FOR YOUR APPOINTMENT - PLEASE REVIEW IMPORTANT INFORMATION BELOW SEVERAL DAYS PRIOR TO YOUR APPOINTMENT ° °Due to the recent COVID-19 pandemic, we are transitioning in-person office visits to tele-medicine visits in an effort to decrease unnecessary exposure to our patients, their families, and staff. These visits are billed to your insurance just like a normal visit is. We also encourage you to sign up for MyChart if you have not already done so. You will need a smartphone if possible. For patients that do not have this, we can still complete the visit using a regular telephone but do prefer a smartphone to enable video when possible. You may have a family member that lives with you that can help. If possible, we also ask that you have a blood pressure cuff and scale at home to measure your blood pressure, heart rate and weight prior to your scheduled appointment. Patients with clinical needs that need an in-person evaluation and testing will still be able to come to the office if absolutely necessary. If you have any questions, feel free to call our office. ° ° ° ° °YOUR PROVIDER WILL BE USING THE FOLLOWING PLATFORM TO COMPLETE YOUR VISIT:  ° °• IF USING DOXIMITY or [email or web address] - The staff will give you instructions on receiving your link to join the meeting the day of your visit.  ° ° °2-3 DAYS BEFORE YOUR APPOINTMENT ° °You will receive a telephone call from one of our [facility] team members - your caller ID may say "Unknown caller." If this is a video visit, we will walk you through how to get the video launched on your phone. We will remind you check your blood pressure, heart rate and weight prior to your scheduled appointment. If you have an Apple Watch or Kardia, please upload any pertinent ECG strips the day before or morning of your appointment to MyChart. Our staff will also make sure you have reviewed the consent and agree to move forward with your scheduled  tele-health visit.  ° ° ° °THE DAY OF YOUR APPOINTMENT ° °Approximately 15 minutes prior to your scheduled appointment, you will receive a telephone call from one of [facility] team - your caller ID may say "Unknown caller."  Our staff will confirm medications, vital signs for the day and any symptoms you may be experiencing. Please have this information available prior to the time of visit start. It may also be helpful for you to have a pad of paper and pen handy for any instructions given during your visit. They will also walk you through joining the smartphone meeting if this is a video visit. ° ° ° °CONSENT FOR TELE-HEALTH VISIT - PLEASE REVIEW ° °I hereby voluntarily request, consent and authorize [facility] and its employed or contracted physicians, physician assistants, nurse practitioners or other licensed health care professionals (the Practitioner), to provide me with telemedicine health care services (the “Services") as deemed necessary by the treating Practitioner. I acknowledge and consent to receive the Services by the Practitioner via telemedicine. I understand that the telemedicine visit will involve communicating with the Practitioner through live audiovisual communication technology and the disclosure of certain medical information by electronic transmission. I acknowledge that I have been given the opportunity to request an in-person assessment or other available alternative prior to the telemedicine visit and am voluntarily participating in the telemedicine visit. ° °I understand that I have the right to withhold or withdraw my consent to the use of   telemedicine in the course of my care at any time, without affecting my right to future care or treatment, and that the Practitioner or I may terminate the telemedicine visit at any time. I understand that I have the right to inspect all information obtained and/or recorded in the course of the telemedicine visit and may receive copies of available  information for a reasonable fee.  I understand that some of the potential risks of receiving the Services via telemedicine include:  °• Delay or interruption in medical evaluation due to technological equipment failure or disruption; °• Information transmitted may not be sufficient (e.g. poor resolution of images) to allow for appropriate medical decision making by the Practitioner; and/or  °• In rare instances, security protocols could fail, causing a breach of personal health information. ° °Furthermore, I acknowledge that it is my responsibility to provide information about my medical history, conditions and care that is complete and accurate to the best of my ability. I acknowledge that Practitioner's advice, recommendations, and/or decision may be based on factors not within their control, such as incomplete or inaccurate data provided by me or distortions of diagnostic images or specimens that may result from electronic transmissions. I understand that the practice of medicine is not an exact science and that Practitioner makes no warranties or guarantees regarding treatment outcomes. I acknowledge that I will receive a copy of this consent concurrently upon execution via email to the email address I last provided but may also request a printed copy by calling the office of [facility].   ° °I understand that my insurance will be billed for this visit.  ° °I have read or had this consent read to me. °• I understand the contents of this consent, which adequately explains the benefits and risks of the Services being provided via telemedicine.  °• I have been provided ample opportunity to ask questions regarding this consent and the Services and have had my questions answered to my satisfaction. °• I give my informed consent for the services to be provided through the use of telemedicine in my medical care ° °By participating in this telemedicine visit I agree to the above. °

## 2018-12-05 NOTE — Discharge Summary (Signed)
Discharge Summary    Patient ID: Marcus Peterson MRN: [id number]; DOB: 01-Oct-1950  Admit date: 12/03/2018 Discharge date: 12/05/2018  Primary Care Provider: Lajean Manes, MD  Primary Cardiologist: Sherren Mocha, MD   Discharge Diagnoses    Active Problems:   ST elevation myocardial infarction involving left circumflex coronary artery (HCC)   CAD   HTN   HLD  RA Allergies No Known Allergies  Diagnostic Studies/Procedures    Echo 12/04/2018 IMPRESSIONS    1. The left ventricle has normal systolic function with an ejection fraction of 60-65%. The cavity size was normal. Left ventricular diastolic Doppler parameters are consistent with impaired relaxation. Normal wall motion.  2. The right ventricle has normal systolic function. The cavity was normal. There is no increase in right ventricular wall thickness. Right ventricular systolic pressure could not be assessed.  3. The aorta is normal unless otherwise noted.  CORONARY/GRAFT ACUTE MI REVASCULARIZATION  LEFT HEART CATH AND CORONARY ANGIOGRAPHY  Conclusion    A drug-eluting stent was successfully placed using a STENT SYNERGY DES 2.5X24.  Post intervention, there is a 0% residual stenosis.  A drug-eluting stent was successfully placed using a STENT SYNERGY DES 2.5X24.  Post intervention, there is a 0% residual stenosis.  The left ventricular ejection fraction is 55-65% by visual estimate.  LV end diastolic pressure is normal.  The left ventricular systolic function is normal.   1.  Severe left circumflex/obtuse marginal stenosis treated successfully with PCI using a 2.5 x 24 mm Synergy DES 2.  Moderate proximal LAD stenosis estimated at 50% 3.  Widely patent left main and RCA with no significant stenosis 4.  Normal LV systolic function with normal LVEDP, LVEF is estimated at 55 to 60%  Recommendations: If no complications arise, consider fast-track discharge.  Patient with successful PCI and preserved LV  function.      History of Present Illness     Marcus Peterson is a 68 year old male with hypertension, RA and mixed hyperlipidemia presented via EMS as code STEMI.  Prior to presentation patient was sick 2 weeks ago.  He had fever with cough.  He was tested negative for COVID-19.  Treated empirically with doxycycline with improved symptoms.  However patient had increased weakness and chest pain.  He had a constant burning in substernal region for few hours.  EMS was called and found to have inferior lateral ST elevation.  Hospital Course     Consultants: None  1. Acute lateral STEMI: Emergent cardiac catheterization showed severe left circumflex/obtuse marginal stenosis which stented successfully with synergy DES.  Moderate proximal LAD stenosis at 50%.  Widely patent left main and RCA.  Normal systolic function and LVEDP by cath.  Echocardiogram showed normal LV function at 60 to 65%. Pleuritic chest pain improved.  Ambulated well.  Plan for cardiac rehab phase 2 as outpatient.  Continue aspirin, Brilinta, statin and beta-blocker.  2.  Hypertension -Blood pressure stable on beta-blocker and ACE.  Will continue to hold home amlodipine.  Advised to monitor his blood pressure at home.  3. HLD - 12/04/2018: Cholesterol 136; HDL 24; LDL Cholesterol 59; Triglycerides 266; VLDL 53  -Continue high intensity statin.  Discharge Vitals Blood pressure 116/71, pulse 88, temperature 98.2 F (36.8 C), temperature source Oral, resp. rate (!) 25, height 5\' 11"  (1.803 m), weight 113.4 kg, SpO2 94 %.  Filed Weights   12/03/18 1836  Weight: 113.4 kg    Labs & Radiologic Studies    CBC Recent Labs  12/04/18 0216 12/05/18 0358  WBC 7.7 7.7  HGB 13.3 13.2  HCT 39.1 38.4*  MCV 97.3 97.7  PLT 321 [id number]   Basic Metabolic Panel Recent Labs    12/04/18 0216 12/05/18 0358  NA 134* 134*  K 4.5 4.1  CL 107 106  CO2 19* 19*  GLUCOSE 88 87  BUN 12 14  CREATININE 0.79 0.89  CALCIUM 9.0 9.3    Fasting Lipid Panel Recent Labs    12/04/18 0216  CHOL 136  HDL 24*  LDLCALC 59  TRIG 266*  CHOLHDL 5.7    Disposition   Pt is being discharged home today in good condition.  Follow-up Plans & Appointments    Follow-up Information    [place], Crista Luria, [place] Follow up on 12/15/2018.   Specialty: Cardiology Why: @1pm  post hospital (virtual visit) Contact information: [place] [address] [phone]          Discharge Instructions    Amb Referral to Cardiac Rehabilitation   Complete by: As directed    Diagnosis:  STEMI Coronary Stents     After initial evaluation and assessments completed: Virtual Based Care may be provided alone or in conjunction with Phase 2 Cardiac Rehab based on patient barriers.: Yes   Diet - low sodium heart healthy   Complete by: As directed    Discharge instructions   Complete by: As directed    No driving for 2 weeks. No lifting over 10 lbs for 4 weeks. No sexual activity for 4 weeks. You may not return to work until cleared by your cardiologist. Keep procedure site clean & dry. If you notice increased pain, swelling, bleeding or pus, call/return!  You may shower, but no soaking baths/hot tubs/pools for 1 week.   Increase activity slowly   Complete by: As directed       Discharge Medications   Allergies as of 12/05/2018   No Known Allergies     Medication List    STOP taking these medications   amLODipine 10 MG tablet Commonly known as: NORVASC     TAKE these medications   aspirin 81 MG EC tablet Take 1 tablet (81 mg total) by mouth daily. Start taking on: December 06, 2018   atorvastatin 80 MG tablet Commonly known as: LIPITOR Take 1 tablet (80 mg total) by mouth daily at 6 PM.   benazepril 20 MG tablet Commonly known as: LOTENSIN Take 20 mg by mouth daily.   CALCIUM + D PO Take 1 tablet by mouth daily.   folic acid 1 MG tablet Commonly known as: FOLVITE Take 1 mg by mouth daily.    methotrexate 2.5 MG tablet Take 17.5 mg by mouth once a week. On Fridays   metoprolol succinate 50 MG 24 hr tablet Commonly known as: TOPROL-XL TAKE ONE TABLET BY MOUTH ONE TIME DAILY   multivitamin tablet Take 1 tablet by mouth daily.   nitroGLYCERIN 0.4 MG SL tablet Commonly known as: NITROSTAT Place 0.4 mg under the tongue every 5 (five) minutes as needed for chest pain.   pantoprazole 40 MG tablet Commonly known as: PROTONIX Take 1 tablet (40 mg total) by mouth daily. Start taking on: December 06, 2018   ticagrelor 90 MG Tabs tablet Commonly known as: BRILINTA Take 1 tablet (90 mg total) by mouth 2 (two) times daily.        Acute coronary syndrome (MI, NSTEMI, STEMI, etc) this admission?: Yes.     AHA/ACC Clinical Performance &  Quality Measures: 1. Aspirin prescribed? - Yes 2. ADP Receptor Inhibitor (Plavix/Clopidogrel, Brilinta/Ticagrelor or Effient/Prasugrel) prescribed (includes medically managed patients)? - Yes 3. Beta Blocker prescribed? - Yes 4. High Intensity Statin (Lipitor 40-80mg  or Crestor 20-40mg ) prescribed? - Yes 5. EF assessed during THIS hospitalization? - Yes 6. For EF <40%, was ACEI/ARB prescribed? - Yes 7. For EF <40%, Aldosterone Antagonist (Spironolactone or Eplerenone) prescribed? - Not Applicable (EF >/= [id number]) 8. Cardiac Rehab Phase II ordered (Included Medically managed Patients)? - Yes     Outstanding Labs/Studies   Consider OP f/u labs 6-8 weeks given statin initiation this admission.  Duration of Discharge Encounter   Greater than 30 minutes including physician time.  Mahalia Longest Dell, PA 12/05/2018, 12:01 PM

## 2018-12-05 NOTE — Progress Notes (Signed)
Discharge instructions given to patient and wife, both verbalized understanding. No further questions. Escorted out via wheelchair. Marland Kitchen

## 2018-12-05 NOTE — Telephone Encounter (Signed)
**Note De-Identified  Obfuscation** The pt is being discharged from the hospital today. We will call him on Monday 12/08/2018.

## 2018-12-05 NOTE — Progress Notes (Signed)
   TELEPHONE CALL NOTE  I have personally reviewed televisit info with patient and wife at bedside   This patient has been deemed a candidate for follow-up tele-health visit to limit community exposure during the Covid-19 pandemic. I spoke with the patient via phone to discuss instructions. This has been outlined on the patient's AVS (dotphrase: hcevisitinfo). The patient was advised to review the section on consent for treatment as well. The patient will receive a phone call 2-3 days prior to their E-Visit at which time consent will be verbally confirmed.   A Virtual Office Visit appointment type has been scheduled for TOC follow up with Robbie Lis, PAC, with "VIDEO" or "TELEPHONE" in the appointment notes - patient prefers VIDEO  type.  I have either confirmed the patient is active in MyChart or offered to send sign-up link to phone/email via Mychart icon beside patient's photo.  [place], [place] 12/05/2018 12:03 PM

## 2018-12-08 NOTE — Telephone Encounter (Addendum)
**Note De-Identified  Obfuscation** The pt came to the phone and gave permission for me to s/w his wife Langley Gauss concerning his care.  Patient and his wife Langley Gauss contacted regarding the pts discharge from [facility] on 12/05/2018.  Patient and his wife understands that the pt is to follow up with provider Robbie Lis, PA-c on 12/15/2018 at 1 pm at [facility] in [place]. Patient and his wife understands the pts discharge instructions? Yes Patient and his wife understands the pts medications and regiment? Yes Langley Gauss is concerned about the cost of Brilinta and is not familiar with the pts ins coverage. I have asked her to contact the pts ins provider to ask the following: 1. Is there a deductible? 2. If so, how much is his deductible? 3. How much will Brilinta cost the pt once his deductible has been met? She is aware to call me back if she finds that they cannot afford Brilinta.  Patientand his wife understands to bring all medications to this visit? Yes

## 2018-12-09 ENCOUNTER — Telehealth (HOSPITAL_COMMUNITY): Payer: Self-pay

## 2018-12-09 NOTE — Telephone Encounter (Signed)
Called patient to see if he is interested in the Cardiac Rehab Program. Patient expressed interest. Explained scheduling process and went over insurance, patient verbalized understanding. Will contact patient for scheduling once f/u has been completed.  °

## 2018-12-09 NOTE — Telephone Encounter (Signed)
Appt changed from a in office hospital f/u to a Virtual hospital visit on 12/15/2018 at 1 pm with Vin and scheduling did contact the pts wife concerning this change. The pt and his wife are in agreement with change in appt.  Also, the pts wife Langley Gauss asked to s/w me.  Per Langley Gauss she did call the pts ins provider and was advised that the cost of Brilinta for the pt will be $141/3 mth supply. They had originally thought  that $141 was for a 30 day supply. She states that since the cost is for a 90 day supply they can afford it. She is aware to call me back if circumstances change and they can no longer afford. She thanked me for my help.

## 2018-12-09 NOTE — Telephone Encounter (Signed)
Per Vin I will forward this message to scheduling as urgent to reschedule the pts hospital f/u to a virtual visit with Vin Bhagat, PA-c.

## 2018-12-09 NOTE — Telephone Encounter (Signed)
Pt insurance is active and benefits verified through [facility]. Co-pay $20.00, DED $0.00/$0.00 met, out of pocket $3,600.00/$179.20 met, co-insurance 0%. No pre-authorization required. Passport, 12/09/2018 @ 2:23PM, [id number]  Will contact patient to see if he is interested in the Cardiac Rehab Program. If interested, patient will need to complete follow up appt. Once completed, patient will be contacted for scheduling upon review by the RN Navigator.

## 2018-12-13 NOTE — Progress Notes (Signed)
Cardiology Office Note    Date:  12/15/2018   ID:  Marcus Peterson, DOB 06-17-1950, MRN [id number]  PCP:  Lajean Manes, MD  Cardiologist:  Dr. Burt Knack  Chief Complaint: Hospital follow up   History of Present Illness:   Marcus Peterson is a 68 y.o. male with hypertension, RA,  mixed hyperlipidemia and recent STEMI here for follow up.   Admitted 11/2018 with lateral STEMI. Emergent cardiac catheterization showed severe left circumflex/obtuse marginal stenosis which stented successfully with synergy DES.  Moderate proximal LAD stenosis at 50%.  Widely patent left main and RCA.  Normal systolic function and LVEDP by cath.  Echocardiogram showed normal LV function at 60 to 65%. Pleuritic chest pain improved. Started on high intensity statin.   Here today for follow up.  He has intermittent shortness of breath lasting for few seconds.  Spontaneously resolves.  Improved since discharge.  Denies chest pain, palpitation, dizziness, orthopnea, PND, syncope, lower extremity edema or melena.  Compliant with his medication.   Past Medical History:  Diagnosis Date  . Arthritis   . CAD in native artery    a. cath 11/2018 - Severe left circumflex/obtuse marginal stenosis s/p DES to both. 50% LAD>> medical therapy   . Cancer ([place])    melanoma on right arm   . Central serous choroidopathy   . Erectile dysfunction    Situation reaction  . GERD (gastroesophageal reflux disease)   . Hyperlipidemia   . Hypertension   . Nonspecific abnormal unspecified cardiovascular function study   . Rheumatic arteritis   . Right wrist fracture   . Shingles 2010    Past Surgical History:  Procedure Laterality Date  . adenomotous colon polyp N/A 2001 and 2012  . COLONOSCOPY WITH PROPOFOL N/A 11/22/2015   Procedure: COLONOSCOPY WITH PROPOFOL;  Surgeon: Garlan Fair, MD;  Location: [facility];  Service: Endoscopy;  Laterality: N/A;  . CORONARY/GRAFT ACUTE MI REVASCULARIZATION N/A 12/03/2018   Procedure:  CORONARY/GRAFT ACUTE MI REVASCULARIZATION;  Surgeon: Sherren Mocha, MD;  Location: [facility];  Service: Cardiovascular;  Laterality: N/A;  . HERNIA REPAIR  01/2011   RIH  . JOINT REPLACEMENT     left knee  . LEFT HEART CATH AND CORONARY ANGIOGRAPHY N/A 12/03/2018   Procedure: LEFT HEART CATH AND CORONARY ANGIOGRAPHY;  Surgeon: Sherren Mocha, MD;  Location: [facility];  Service: Cardiovascular;  Laterality: N/A;  . LEFT HEART CATHETERIZATION WITH CORONARY ANGIOGRAM N/A 11/05/2012   Procedure: LEFT HEART CATHETERIZATION WITH CORONARY ANGIOGRAM;  Surgeon: Jettie Booze, MD;  Location: [facility];  Service: Cardiovascular;  Laterality: N/A;  . left knee replacement Left    contracture, L knee replacement  . s/p arthroscopic surgery right elbow Right   . SPINE SURGERY     lumbar fusion    Current Medications:  Prior to Admission medications   Medication Sig Start Date End Date Taking? Authorizing Provider  aspirin EC 81 MG EC tablet Take 1 tablet (81 mg total) by mouth daily. 12/06/18  Yes Othella Slappey, PA  atorvastatin (LIPITOR) 80 MG tablet Take 1 tablet (80 mg total) by mouth daily at 6 PM. 12/05/18  Yes Shaliah Wann, PA  benazepril (LOTENSIN) 20 MG tablet Take 20 mg by mouth daily.   Yes [provider]  Calcium Carbonate-Vitamin D (CALCIUM + D PO) Take 1 tablet by mouth daily.    Yes [provider]  folic acid (FOLVITE) 1 MG tablet Take 1 mg by  mouth daily.   Yes [provider]  methotrexate 2.5 MG tablet Take 17.5 mg by mouth once a week. On Fridays   Yes [provider]  metoprolol succinate (TOPROL-XL) 50 MG 24 hr tablet Take 50 mg by mouth daily. Take with or immediately following a meal.   Yes [provider]  Multiple Vitamin (MULTIVITAMIN) tablet Take 1 tablet by mouth daily.     Yes [provider]  nitroGLYCERIN (NITROSTAT) 0.4 MG SL tablet Place 0.4 mg under the tongue every 5 (five)  minutes as needed for chest pain.   Yes [provider]  pantoprazole (PROTONIX) 40 MG tablet Take 1 tablet (40 mg total) by mouth daily. 12/06/18  Yes Jaelynn Currier, PA  ticagrelor (BRILINTA) 90 MG TABS tablet Take 1 tablet (90 mg total) by mouth 2 (two) times daily. 12/05/18  Yes Roxanne Panek, Crista Luria, PA     Allergies:   Patient has no known allergies.   Social History   Socioeconomic History  . Marital status: Married    Spouse name: Not on file  . Number of children: Not on file  . Years of education: Not on file  . Highest education level: Not on file  Occupational History  . Not on file  Social Needs  . Financial resource strain: Not on file  . Food insecurity    Worry: Not on file    Inability: Not on file  . Transportation needs    Medical: Not on file    Non-medical: Not on file  Tobacco Use  . Smoking status: Never Smoker  . Smokeless tobacco: Current User    Types: Chew  Substance and Sexual Activity  . Alcohol use: No  . Drug use: No  . Sexual activity: Not on file  Lifestyle  . Physical activity    Days per week: Not on file    Minutes per session: Not on file  . Stress: Not on file  Relationships  . Social Herbalist on phone: Not on file    Gets together: Not on file    Attends religious service: Not on file    Active member of club or organization: Not on file    Attends meetings of clubs or organizations: Not on file    Relationship status: Not on file  Other Topics Concern  . Not on file  Social History Narrative  . Not on file     Family History:  The patient's family history includes Cancer in his mother.   ROS:   Please see the history of present illness.    ROS All other systems reviewed and are negative.   PHYSICAL EXAM:   VS:  BP (!) 148/84   Pulse 69   Ht 5\' 10"  (1.778 m)   Wt 221 lb 1.9 oz (100.3 kg)   SpO2 95%   BMI 31.73 kg/m    Repeat BP 130/80  GEN: Well nourished, well developed, in no acute  distress  HEENT: normal  Neck: no JVD, carotid bruits, or masses Cardiac:RRR; no murmurs, rubs, or gallops,no edema  Respiratory:  clear to auscultation bilaterally, normal work of breathing GI: soft, nontender, nondistended, + BS MS: no deformity or atrophy  Skin: warm and dry, no rash Neuro:  Alert and Oriented x 3, Strength and sensation are intact Psych: euthymic mood, full affect  Wt Readings from Last 3 Encounters:  12/15/18 221 lb 1.9 oz (100.3 kg)  12/03/18 250 lb (113.4 kg)  11/22/15  250 lb (113.4 kg)      Studies/Labs Reviewed:   EKG:  EKG is ordered today.  The ekg ordered today demonstrates normal sinus rhythm Recent Labs: 12/05/2018: BUN 14; Creatinine, Ser 0.89; Hemoglobin 13.2; Platelets 291; Potassium 4.1; Sodium 134   Lipid Panel    Component Value Date/Time   CHOL 136 12/04/2018 0216   TRIG 266 (H) 12/04/2018 0216   HDL 24 (L) 12/04/2018 0216   CHOLHDL 5.7 12/04/2018 0216   VLDL 53 (H) 12/04/2018 0216   LDLCALC 59 12/04/2018 0216    Additional studies/ records that were reviewed today include:   Echo 12/04/2018 IMPRESSIONS   1. The left ventricle has normal systolic function with an ejection fraction of 60-65%. The cavity size was normal. Left ventricular diastolic Doppler parameters are consistent with impaired relaxation. Normal wall motion. 2. The right ventricle has normal systolic function. The cavity was normal. There is no increase in right ventricular wall thickness. Right ventricular systolic pressure could not be assessed. 3. The aorta is normal unless otherwise noted.  CORONARY/GRAFT ACUTE MI REVASCULARIZATION  LEFT HEART CATH AND CORONARY ANGIOGRAPHY  Conclusion    A drug-eluting stent was successfully placed using a STENT SYNERGY DES 2.5X24.  Post intervention, there is a 0% residual stenosis.  A drug-eluting stent was successfully placed using a STENT SYNERGY DES 2.5X24.  Post intervention, there is a 0% residual stenosis.   The left ventricular ejection fraction is 55-65% by visual estimate.  LV end diastolic pressure is normal.  The left ventricular systolic function is normal.  1. Severe left circumflex/obtuse marginal stenosis treated successfully with PCI using a 2.5 x 24 mm Synergy DES 2. Moderate proximal LAD stenosis estimated at 50% 3. Widely patent left main and RCA with no significant stenosis 4. Normal LV systolic function with normal LVEDP, LVEF is estimated at 55 to 60%  Recommendations: If no complications arise, consider fast-track discharge. Patient with successful PCI and preserved LV function.       ASSESSMENT & PLAN:    1. CAD No angina.  His shortness of breath could be due to Brilinta which is improving since discharge.  Advised to take Brilinta with caffeine intake.  He will let us know if worsening of symptoms.  Continue aspirin, Brilinta, beta-blocker and statin.  2. HTN -Blood pressure well controlled at home at 120s to 130s / 70-80s.  Continue current medical therapy.  3. HLD -Continue high intensity statin.  Repeat check in 6 to 8 weeks. - 12/04/2018: Cholesterol 136; HDL 24; LDL Cholesterol 59; Triglycerides 266; VLDL 53   Medication Adjustments/Labs and Tests Ordered: Current medicines are reviewed at length with the patient today.  Concerns regarding medicines are outlined above.  Medication changes, Labs and Tests ordered today are listed in the Patient Instructions below. Patient Instructions  Medication Instructions:  Your physician recommends that you continue on your current medications as directed. Please refer to the Current Medication list given to you today.  If you need a refill on your cardiac medications before your next appointment, please call your pharmacy.   Lab work: 01/26/2019:  9:45 come fasting.. nothing to eat or drink after midnight the night before for Lipid & Lft's  If you have labs (blood work) drawn today and your tests are  completely normal, you will receive your results only by: Marland Kitchen MyChart Message (if you have MyChart) OR . A paper copy in the mail If you have any lab test that is abnormal or we need  to change your treatment, we will call you to review the results.  Testing/Procedures: None ordered  Follow-Up: At [facility], you and your health needs are our priority.  As part of our continuing mission to provide you with exceptional heart care, we have created designated Provider Care Teams.  These Care Teams include your primary Cardiologist (physician) and Advanced Practice Providers (APPs -  Physician Assistants and Nurse Practitioners) who all work together to provide you with the care you need, when you need it. You will need a follow up appointment in:  4 months.  Please call our office 2 months in advance to schedule this appointment.  You may see Sherren Mocha, MD or one of the following Advanced Practice Providers on your designated Care Team: Richardson Dopp, PA-C [place], [place] . Daune Perch, NP  Any Other Special Instructions Will Be Listed Below (If Applicable).       Jarrett Soho, [place]  12/15/2018 10:22 AM    [facility] [address], [place], [place]  [address] Phone: [phone]; Fax: [phone]

## 2018-12-15 ENCOUNTER — Ambulatory Visit (INDEPENDENT_AMBULATORY_CARE_PROVIDER_SITE_OTHER): Payer: Medicare Other | Admitting: Physician Assistant

## 2018-12-15 ENCOUNTER — Encounter: Payer: Self-pay | Admitting: Physician Assistant

## 2018-12-15 ENCOUNTER — Other Ambulatory Visit: Payer: Self-pay

## 2018-12-15 VITALS — BP 148/84 | HR 69 | Ht 70.0 in | Wt 221.1 lb

## 2018-12-15 DIAGNOSIS — I2510 Atherosclerotic heart disease of native coronary artery without angina pectoris: Secondary | ICD-10-CM

## 2018-12-15 DIAGNOSIS — I10 Essential (primary) hypertension: Secondary | ICD-10-CM | POA: Diagnosis not present

## 2018-12-15 DIAGNOSIS — E785 Hyperlipidemia, unspecified: Secondary | ICD-10-CM | POA: Diagnosis not present

## 2018-12-15 DIAGNOSIS — Z79899 Other long term (current) drug therapy: Secondary | ICD-10-CM

## 2018-12-15 NOTE — Patient Instructions (Signed)
Medication Instructions:  Your physician recommends that you continue on your current medications as directed. Please refer to the Current Medication list given to you today.  If you need a refill on your cardiac medications before your next appointment, please call your pharmacy.   Lab work: 01/26/2019:  9:45 come fasting.. nothing to eat or drink after midnight the night before for Lipid & Lft's  If you have labs (blood work) drawn today and your tests are completely normal, you will receive your results only by: Marland Kitchen MyChart Message (if you have MyChart) OR . A paper copy in the mail If you have any lab test that is abnormal or we need to change your treatment, we will call you to review the results.  Testing/Procedures: None ordered  Follow-Up: At [facility], you and your health needs are our priority.  As part of our continuing mission to provide you with exceptional heart care, we have created designated Provider Care Teams.  These Care Teams include your primary Cardiologist (physician) and Advanced Practice Providers (APPs -  Physician Assistants and Nurse Practitioners) who all work together to provide you with the care you need, when you need it. You will need a follow up appointment in:  4 months.  Please call our office 2 months in advance to schedule this appointment.  You may see Sherren Mocha, MD or one of the following Advanced Practice Providers on your designated Care Team: Richardson Dopp, PA-C [place], [place] . Daune Perch, NP  Any Other Special Instructions Will Be Listed Below (If Applicable).

## 2018-12-17 ENCOUNTER — Telehealth (HOSPITAL_COMMUNITY): Payer: Self-pay

## 2018-12-17 NOTE — Telephone Encounter (Signed)
Called patient to see if he was interested in participating in the Cardiac Rehab Program. Patient stated yes. Patient will come in for orientation on 01/13/2019 @ 9AM and will attend the 9AM exercise class.  Tourist information centre manager.

## 2019-01-07 ENCOUNTER — Telehealth (HOSPITAL_COMMUNITY): Payer: Self-pay | Admitting: Pharmacist

## 2019-01-08 NOTE — Telephone Encounter (Signed)
Cardiac Rehab Medication Review by a Pharmacist  Does the patient  feel that his/her medications are working for him/her?  yes  Has the patient been experiencing any side effects to the medications prescribed?  no  Does the patient measure his/her own blood pressure or blood glucose at home?  yes at night  Does the patient have any problems obtaining medications due to transportation or finances?  Patient states that he filled his new medications through the [facility] pharmacy in the hospital and expresses concern for obtaining his refills from [facility] or Mail Order Pharm[facility]. I told him that [facility] should send his prescriptions to his pharmacy after the first fill.  Understanding of regimen: fair Understanding of indications: good Potential of compliance: good  Pharmacist comments: Patient states that someone from cardiac rehab earlier today mentioned co-pays for his cardiac rehab visits. He would like to know how much the visits will be.  Richardine Service, PharmD PGY1 Pharmacy Resident Phone: [phone] 01/08/2019  4:04 PM

## 2019-01-13 ENCOUNTER — Other Ambulatory Visit: Payer: Self-pay

## 2019-01-13 ENCOUNTER — Encounter (HOSPITAL_COMMUNITY)
Admission: RE | Admit: 2019-01-13 | Discharge: 2019-01-13 | Disposition: A | Discharge status: Home / Self Care | Payer: Medicare Other | Source: Ambulatory Visit | Attending: Cardiovascular Disease | Admitting: Cardiovascular Disease

## 2019-01-13 ENCOUNTER — Encounter (HOSPITAL_COMMUNITY): Payer: Self-pay

## 2019-01-13 VITALS — BP 180/70 | Temp 97.9°F | Ht 69.5 in | Wt 222.0 lb

## 2019-01-13 DIAGNOSIS — I2121 ST elevation (STEMI) myocardial infarction involving left circumflex coronary artery: Secondary | ICD-10-CM

## 2019-01-13 DIAGNOSIS — Z955 Presence of coronary angioplasty implant and graft: Secondary | ICD-10-CM

## 2019-01-13 HISTORY — DX: Atherosclerotic heart disease of native coronary artery without angina pectoris: I25.10

## 2019-01-13 NOTE — Progress Notes (Signed)
Cardiac Individual Treatment Plan  Patient Details  Name: Marcus Peterson MRN: [id number] Date of Birth: 20-Jul-1950 Referring Provider:     [employer] from 01/13/2019 in [place]  Referring Provider  Dr. Burt Knack      Initial Encounter Date:    CARDIAC REHAB PHASE II ORIENTATION from 01/13/2019 in [place]  Date  01/13/19      Visit Diagnosis: ST elevation myocardial infarction involving left circumflex coronary artery [facility])  Status post coronary artery stent placement  Patient's Home Medications on Admission:  Current Outpatient Medications:  .  aspirin EC 81 MG EC tablet, Take 1 tablet (81 mg total) by mouth daily., Disp: 30 tablet, Rfl: 11 .  atorvastatin (LIPITOR) 80 MG tablet, Take 1 tablet (80 mg total) by mouth daily at 6 PM., Disp: 30 tablet, Rfl: 6 .  benazepril (LOTENSIN) 20 MG tablet, Take 20 mg by mouth daily., Disp: , Rfl:  .  Calcium Carbonate-Vitamin D (CALCIUM + D PO), Take 1 tablet by mouth daily. , Disp: , Rfl:  .  folic acid (FOLVITE) 1 MG tablet, Take 1 mg by mouth daily., Disp: , Rfl:  .  inFLIXimab-abda (RENFLEXIS IV), Inject into the vein. Takes injection every 8 weeks for RA, Disp: , Rfl:  .  methotrexate 2.5 MG tablet, Take 17.5 mg by mouth once a week. On Fridays, Disp: , Rfl:  .  metoprolol succinate (TOPROL-XL) 50 MG 24 hr tablet, Take 50 mg by mouth daily. Take with or immediately following a meal., Disp: , Rfl:  .  Multiple Vitamin (MULTIVITAMIN) tablet, Take 1 tablet by mouth daily.  , Disp: , Rfl:  .  pantoprazole (PROTONIX) 40 MG tablet, Take 1 tablet (40 mg total) by mouth daily., Disp: 30 tablet, Rfl: 3 .  tadalafil (CIALIS) 20 MG tablet, Take 20 mg by mouth daily as needed for erectile dysfunction. PRN, Disp: , Rfl:  .  ticagrelor (BRILINTA) 90 MG TABS tablet, Take 1 tablet (90 mg total) by mouth 2 (two) times daily., Disp: 60 tablet, Rfl: 11 .   nitroGLYCERIN (NITROSTAT) 0.4 MG SL tablet, Place 0.4 mg under the tongue every 5 (five) minutes as needed for chest pain., Disp: , Rfl:   Past Medical History: Past Medical History:  Diagnosis Date  . Arthritis   . CAD in native artery    a. cath 11/2018 - Severe left circumflex/obtuse marginal stenosis s/p DES to both. 50% LAD>> medical therapy   . Cancer ([place])    melanoma on right arm   . Central serous choroidopathy   . Coronary artery disease   . Erectile dysfunction    Situation reaction  . GERD (gastroesophageal reflux disease)   . Hyperlipidemia   . Hypertension   . MI (myocardial infarction) ([place]) 12/03/2018  . Nonspecific abnormal unspecified cardiovascular function study   . Rheumatic arteritis   . Right wrist fracture   . Shingles 2010    Tobacco Use: Social History   Tobacco Use  Smoking Status Never Smoker  Smokeless Tobacco Former Systems developer  . Types: Chew    Labs: Recent Review Flowsheet Data    Labs for ITP Cardiac and Pulmonary Rehab Latest Ref Rng & Units 12/03/2018 12/04/2018   Cholestrol 0 - 200 mg/dL - 136   LDLCALC 0 - 99 mg/dL - 59   HDL >40 mg/dL - 24(L)   Trlycerides <150 mg/dL - 266(H)   TCO2 22 - 32 mmol/L  18(L) -      Capillary Blood Glucose: No results found for: GLUCAP   Exercise Target Goals: Exercise Program Goal: Individual exercise prescription set using results from initial 6 min walk test and THRR while considering  patient's activity barriers and safety.   Exercise Prescription Goal: Initial exercise prescription builds to 30-45 minutes a day of aerobic activity, 2-3 days per week.  Home exercise guidelines will be given to patient during program as part of exercise prescription that the participant will acknowledge.  Activity Barriers & Risk Stratification: Activity Barriers & Cardiac Risk Stratification - 01/13/19 1014      Activity Barriers & Cardiac Risk Stratification   Activity Barriers  Deconditioning;Left Knee  Replacement;Neck/Spine Problems;Balance Concerns   Spinal Fusion   Cardiac Risk Stratification  High       6 Minute Walk: 6 Minute Walk    Row Name 01/13/19 1013         6 Minute Walk   Phase  Initial     Distance  1565 feet     Walk Time  6 minutes     # of Rest Breaks  0     MPH  2.9     METS  3.5     RPE  12     Perceived Dyspnea   0     VO2 Peak  12.59     Symptoms  No     Resting HR  68 bpm     Resting BP  162/80     Resting Oxygen Saturation   97 %     Exercise Oxygen Saturation  during 6 min walk  97 %     Max Ex. HR  107 bpm     Max Ex. BP  164/88     2 Minute Post BP  160/86        Oxygen Initial Assessment:   Oxygen Re-Evaluation:   Oxygen Discharge (Final Oxygen Re-Evaluation):   Initial Exercise Prescription: Initial Exercise Prescription - 01/13/19 1000      Date of Initial Exercise RX and Referring Provider   Date  01/13/19    Referring Provider  Dr. Burt Knack    Expected Discharge Date  03/13/19      Recumbant Bike   Level  2    Watts  35    Minutes  15    METs  3.1      NuStep   Level  3    SPM  85    Minutes  15    METs  2.9      Prescription Details   Frequency (times per week)  2    Duration  Progress to 30 minutes of continuous aerobic without signs/symptoms of physical distress      Intensity   THRR 40-80% of Max Heartrate  67-122    Ratings of Perceived Exertion  11-13      Progression   Progression  Continue to progress workloads to maintain intensity without signs/symptoms of physical distress.      Resistance Training   Training Prescription  Yes    Weight  4 lbs.     Reps  10-15       Perform Capillary Blood Glucose checks as needed.  Exercise Prescription Changes:   Exercise Comments:   Exercise Goals and Review:  Exercise Goals    Row Name 01/13/19 1018             Exercise Goals   Increase Physical Activity  Yes       Intervention  Provide advice, education, support and counseling about  physical activity/exercise needs.;Develop an individualized exercise prescription for aerobic and resistive training based on initial evaluation findings, risk stratification, comorbidities and participant's personal goals.       Expected Outcomes  Short Term: Attend rehab on a regular basis to increase amount of physical activity.;Long Term: Add in home exercise to make exercise part of routine and to increase amount of physical activity.;Long Term: Exercising regularly at least 3-5 days a week.       Increase Strength and Stamina  Yes       Intervention  Provide advice, education, support and counseling about physical activity/exercise needs.;Develop an individualized exercise prescription for aerobic and resistive training based on initial evaluation findings, risk stratification, comorbidities and participant's personal goals.       Expected Outcomes  Short Term: Increase workloads from initial exercise prescription for resistance, speed, and METs.;Short Term: Perform resistance training exercises routinely during rehab and add in resistance training at home;Long Term: Improve cardiorespiratory fitness, muscular endurance and strength as measured by increased METs and functional capacity (6MWT)       Able to understand and use rate of perceived exertion (RPE) scale  Yes       Intervention  Provide education and explanation on how to use RPE scale       Expected Outcomes  Short Term: Able to use RPE daily in rehab to express subjective intensity level;Long Term:  Able to use RPE to guide intensity level when exercising independently       Knowledge and understanding of Target Heart Rate Range (THRR)  Yes       Intervention  Provide education and explanation of THRR including how the numbers were predicted and where they are located for reference       Expected Outcomes  Short Term: Able to state/look up THRR;Long Term: Able to use THRR to govern intensity when exercising independently;Short Term: Able  to use daily as guideline for intensity in rehab       Able to check pulse independently  Yes       Intervention  Provide education and demonstration on how to check pulse in carotid and radial arteries.;Review the importance of being able to check your own pulse for safety during independent exercise       Expected Outcomes  Short Term: Able to explain why pulse checking is important during independent exercise;Long Term: Able to check pulse independently and accurately       Understanding of Exercise Prescription  Yes       Intervention  Provide education, explanation, and written materials on patient's individual exercise prescription       Expected Outcomes  Short Term: Able to explain program exercise prescription;Long Term: Able to explain home exercise prescription to exercise independently          Exercise Goals Re-Evaluation :   Discharge Exercise Prescription (Final Exercise Prescription Changes):   Nutrition:  Target Goals: Understanding of nutrition guidelines, daily intake of sodium 1500mg , cholesterol 200mg , calories 30% from fat and 7% or less from saturated fats, daily to have 5 or more servings of fruits and vegetables.  Biometrics: Pre Biometrics - 01/13/19 1018      Pre Biometrics   Height  5' 9.5" (1.765 m)    Weight  100.7 kg    Waist Circumference  44.5 inches    Hip Circumference  45 inches    Waist  to Hip Ratio  0.99 %    BMI (Calculated)  32.33    Triceps Skinfold  23 mm    % Body Fat  32.9 %    Grip Strength  36 kg    Flexibility  0 in    Single Leg Stand  5.37 seconds        Nutrition Therapy Plan and Nutrition Goals:   Nutrition Assessments:   Nutrition Goals Re-Evaluation:   Nutrition Goals Re-Evaluation:   Nutrition Goals Discharge (Final Nutrition Goals Re-Evaluation):   Psychosocial: Target Goals: Acknowledge presence or absence of significant depression and/or stress, maximize coping skills, provide positive support system.  Participant is able to verbalize types and ability to use techniques and skills needed for reducing stress and depression.  Initial Review & Psychosocial Screening: Initial Psych Review & Screening - 01/13/19 1110      Initial Review   Current issues with  None Identified      Family Dynamics   Good Support System?  Yes    Comments  No psychosocial barriers to participation in Cardiac Rehab identified. Patient has a strong support system of family, friends, and health care providers. He will continue to maintain a positive attitude and participate in health stress management if barriers to arise.      Barriers   Psychosocial barriers to participate in program  There are no identifiable barriers or psychosocial needs.      Screening Interventions   Interventions  Encouraged to exercise       Quality of Life Scores: Quality of Life - 01/13/19 1029      Quality of Life   Select  Quality of Life      Quality of Life Scores   Health/Function Pre  23.87 %    Socioeconomic Pre  26.63 %    Psych/Spiritual Pre  23.57 %    Family Pre  27.3 %    GLOBAL Pre  24.93 %      Scores of 19 and below usually indicate a poorer quality of life in these areas.  A difference of  2-3 points is a clinically meaningful difference.  A difference of 2-3 points in the total score of the Quality of Life Index has been associated with significant improvement in overall quality of life, self-image, physical symptoms, and general health in studies assessing change in quality of life.  PHQ-9: Recent Review Flowsheet Data    Depression screen [facility] 2/9 01/13/2019   Decreased Interest 0   Down, Depressed, Hopeless 0   PHQ - 2 Score 0     Interpretation of Total Score  Total Score Depression Severity:  1-4 = Minimal depression, 5-9 = Mild depression, 10-14 = Moderate depression, 15-19 = Moderately severe depression, 20-27 = Severe depression   Psychosocial Evaluation and Intervention:   Psychosocial  Re-Evaluation:   Psychosocial Discharge (Final Psychosocial Re-Evaluation):   Vocational Rehabilitation: Provide vocational rehab assistance to qualifying candidates.   Vocational Rehab Evaluation & Intervention: Vocational Rehab - 01/13/19 1110      Initial Vocational Rehab Evaluation & Intervention   Assessment shows need for Vocational Rehabilitation  No       Education: Education Goals: Education classes will be provided on a weekly basis, covering required topics. Participant will state understanding/return demonstration of topics presented.  Learning Barriers/Preferences: Learning Barriers/Preferences - 01/13/19 1022      Learning Barriers/Preferences   Learning Barriers  Sight;Hearing    Learning Preferences  Individual Instruction;Skilled Demonstration  Education Topics: Count Your Pulse:  -Group instruction provided by verbal instruction, demonstration, patient participation and written materials to support subject.  Instructors address importance of being able to find your pulse and how to count your pulse when at home without a heart monitor.  Patients get hands on experience counting their pulse with staff help and individually.   Heart Attack, Angina, and Risk Factor Modification:  -Group instruction provided by verbal instruction, video, and written materials to support subject.  Instructors address signs and symptoms of angina and heart attacks.    Also discuss risk factors for heart disease and how to make changes to improve heart health risk factors.   Functional Fitness:  -Group instruction provided by verbal instruction, demonstration, patient participation, and written materials to support subject.  Instructors address safety measures for doing things around the house.  Discuss how to get up and down off the floor, how to pick things up properly, how to safely get out of a chair without assistance, and balance training.   Meditation and Mindfulness:   -Group instruction provided by verbal instruction, patient participation, and written materials to support subject.  Instructor addresses importance of mindfulness and meditation practice to help reduce stress and improve awareness.  Instructor also leads participants through a meditation exercise.    Stretching for Flexibility and Mobility:  -Group instruction provided by verbal instruction, patient participation, and written materials to support subject.  Instructors lead participants through series of stretches that are designed to increase flexibility thus improving mobility.  These stretches are additional exercise for major muscle groups that are typically performed during regular warm up and cool down.   Hands Only CPR:  -Group verbal, video, and participation provides a basic overview of AHA guidelines for community CPR. Role-play of emergencies allow participants the opportunity to practice calling for help and chest compression technique with discussion of AED use.   Hypertension: -Group verbal and written instruction that provides a basic overview of hypertension including the most recent diagnostic guidelines, risk factor reduction with self-care instructions and medication management.    Nutrition I class: Heart Healthy Eating:  -Group instruction provided by PowerPoint slides, verbal discussion, and written materials to support subject matter. The instructor gives an explanation and review of the Therapeutic Lifestyle Changes diet recommendations, which includes a discussion on lipid goals, dietary fat, sodium, fiber, plant stanol/sterol esters, sugar, and the components of a well-balanced, healthy diet.   Nutrition II class: Lifestyle Skills:  -Group instruction provided by PowerPoint slides, verbal discussion, and written materials to support subject matter. The instructor gives an explanation and review of label reading, grocery shopping for heart health, heart healthy recipe  modifications, and ways to make healthier choices when eating out.   Diabetes Question & Answer:  -Group instruction provided by PowerPoint slides, verbal discussion, and written materials to support subject matter. The instructor gives an explanation and review of diabetes co-morbidities, pre- and post-prandial blood glucose goals, pre-exercise blood glucose goals, signs, symptoms, and treatment of hypoglycemia and hyperglycemia, and foot care basics.   Diabetes Blitz:  -Group instruction provided by PowerPoint slides, verbal discussion, and written materials to support subject matter. The instructor gives an explanation and review of the physiology behind type 1 and type 2 diabetes, diabetes medications and rational behind using different medications, pre- and post-prandial blood glucose recommendations and Hemoglobin A1c goals, diabetes diet, and exercise including blood glucose guidelines for exercising safely.    Portion Distortion:  -Group instruction provided by PowerPoint slides,  verbal discussion, written materials, and food models to support subject matter. The instructor gives an explanation of serving size versus portion size, changes in portions sizes over the last 20 years, and what consists of a serving from each food group.   Stress Management:  -Group instruction provided by verbal instruction, video, and written materials to support subject matter.  Instructors review role of stress in heart disease and how to cope with stress positively.     Exercising on Your Own:  -Group instruction provided by verbal instruction, power point, and written materials to support subject.  Instructors discuss benefits of exercise, components of exercise, frequency and intensity of exercise, and end points for exercise.  Also discuss use of nitroglycerin and activating EMS.  Review options of places to exercise outside of rehab.  Review guidelines for sex with heart disease.   Cardiac Drugs I:   -Group instruction provided by verbal instruction and written materials to support subject.  Instructor reviews cardiac drug classes: antiplatelets, anticoagulants, beta blockers, and statins.  Instructor discusses reasons, side effects, and lifestyle considerations for each drug class.   Cardiac Drugs II:  -Group instruction provided by verbal instruction and written materials to support subject.  Instructor reviews cardiac drug classes: angiotensin converting enzyme inhibitors (ACE-I), angiotensin II receptor blockers (ARBs), nitrates, and calcium channel blockers.  Instructor discusses reasons, side effects, and lifestyle considerations for each drug class.   Anatomy and Physiology of the Circulatory System:  Group verbal and written instruction and models provide basic cardiac anatomy and physiology, with the coronary electrical and arterial systems. Review of: AMI, Angina, Valve disease, Heart Failure, Peripheral Artery Disease, Cardiac Arrhythmia, Pacemakers, and the ICD.   Other Education:  -Group or individual verbal, written, or video instructions that support the educational goals of the cardiac rehab program.   Holiday Eating Survival Tips:  -Group instruction provided by PowerPoint slides, verbal discussion, and written materials to support subject matter. The instructor gives patients tips, tricks, and techniques to help them not only survive but enjoy the holidays despite the onslaught of food that accompanies the holidays.   Knowledge Questionnaire Score: Knowledge Questionnaire Score - 01/13/19 1025      Knowledge Questionnaire Score   Pre Score  15/28       Core Components/Risk Factors/Patient Goals at Admission: Personal Goals and Risk Factors at Admission - 01/13/19 1020      Core Components/Risk Factors/Patient Goals on Admission    Weight Management  Yes;Obesity;Weight Maintenance;Weight Loss    Intervention  Weight Management: Develop a combined nutrition and  exercise program designed to reach desired caloric intake, while maintaining appropriate intake of nutrient and fiber, sodium and fats, and appropriate energy expenditure required for the weight goal.;Weight Management: Provide education and appropriate resources to help participant work on and attain dietary goals.;Weight Management/Obesity: Establish reasonable short term and long term weight goals.;Obesity: Provide education and appropriate resources to help participant work on and attain dietary goals.    Admit Weight  222 lb 0.1 oz (100.7 kg)    Expected Outcomes  Short Term: Continue to assess and modify interventions until short term weight is achieved;Long Term: Adherence to nutrition and physical activity/exercise program aimed toward attainment of established weight goal;Weight Maintenance: Understanding of the daily nutrition guidelines, which includes 25-35% calories from fat, 7% or less cal from saturated fats, less than 200mg  cholesterol, less than 1.5gm of sodium, & 5 or more servings of fruits and vegetables daily;Weight Loss: Understanding of general recommendations for a  balanced deficit meal plan, which promotes 1-2 lb weight loss per week and includes a negative energy balance of [phone] kcal/d;Understanding recommendations for meals to include 15-35% energy as protein, 25-35% energy from fat, 35-60% energy from carbohydrates, less than 200mg  of dietary cholesterol, 20-35 gm of total fiber daily;Understanding of distribution of calorie intake throughout the day with the consumption of 4-5 meals/snacks    Hypertension  Yes    Intervention  Provide education on lifestyle modifcations including regular physical activity/exercise, weight management, moderate sodium restriction and increased consumption of fresh fruit, vegetables, and low fat dairy, alcohol moderation, and smoking cessation.;Monitor prescription use compliance.    Expected Outcomes  Short Term: Continued assessment and  intervention until BP is < 140/19mm HG in hypertensive participants. < 130/58mm HG in hypertensive participants with diabetes, heart failure or chronic kidney disease.;Long Term: Maintenance of blood pressure at goal levels.    Lipids  Yes    Intervention  Provide education and support for participant on nutrition & aerobic/resistive exercise along with prescribed medications to achieve LDL 70mg , HDL >40mg .    Expected Outcomes  Short Term: Participant states understanding of desired cholesterol values and is compliant with medications prescribed. Participant is following exercise prescription and nutrition guidelines.;Long Term: Cholesterol controlled with medications as prescribed, with individualized exercise RX and with personalized nutrition plan. Value goals: LDL < 70mg , HDL > 40 mg.       Core Components/Risk Factors/Patient Goals Review:    Core Components/Risk Factors/Patient Goals at Discharge (Final Review):    ITP Comments: ITP Comments    Row Name 01/13/19 1109           ITP Comments  Dr. Fransico Him, Cardiac Rehab Medical Director          Comments: Patient attended orientation on 01/13/2019 to review rules and guidelines for program.  Completed 6 minute walk test, Intitial ITP, and exercise prescription.  VSS. Telemetry-NSR with occasional PACs.  Asymptomatic. Safety measures and social distancing in place per CDC guidelines.

## 2019-01-19 ENCOUNTER — Ambulatory Visit (HOSPITAL_COMMUNITY): Payer: Medicare Other

## 2019-01-19 ENCOUNTER — Encounter (HOSPITAL_COMMUNITY)
Admission: RE | Admit: 2019-01-19 | Discharge: 2019-01-19 | Disposition: A | Discharge status: Home / Self Care | Payer: Medicare Other | Source: Ambulatory Visit | Attending: Cardiovascular Disease | Admitting: Cardiovascular Disease

## 2019-01-19 ENCOUNTER — Encounter (HOSPITAL_COMMUNITY): Payer: Medicare Other

## 2019-01-19 ENCOUNTER — Other Ambulatory Visit: Payer: Self-pay

## 2019-01-19 DIAGNOSIS — I2121 ST elevation (STEMI) myocardial infarction involving left circumflex coronary artery: Secondary | ICD-10-CM | POA: Diagnosis not present

## 2019-01-19 DIAGNOSIS — Z955 Presence of coronary angioplasty implant and graft: Secondary | ICD-10-CM | POA: Diagnosis present

## 2019-01-19 NOTE — Progress Notes (Signed)
Daily Session Note  Patient Details  Name: Marcus Peterson MRN: [id number] Date of Birth: 06/18/1950 Referring Provider:     [place] from 01/13/2019 in [place]  Referring Provider  Dr. Burt Knack      Encounter Date: 01/19/2019  Check In:   Capillary Blood Glucose: No results found for this or any previous visit (from the past 24 hour(s)).    Social History   Tobacco Use  Smoking Status Never Smoker  Smokeless Tobacco Former Systems developer  . Types: Chew    Goals Met:  blood pressure 170/74 on recumbent bike  Goals Unmet:  na  Comments: Pt started cardiac rehab today.  Pt tolerated light exercise without difficulty.Resting blood pressure in the 140's. Max blood pressure noted at 170/74 with a recheck blood pressure of 160/82. Exit blood pressure 146/82., telemetry-Sinus Rythm, asymptomatic.  Medication list reconciled. Pt denies barriers to medicaiton compliance.  PSYCHOSOCIAL ASSESSMENT:  PHQ-0. Pt exhibits positive coping skills, hopeful outlook with supportive family. No psychosocial needs identified at this time, no psychosocial interventions necessary.    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   Pt oriented to exercise equipment and routine.    Understanding verbalized. Patient says he took his medications as prescribed. Will continue to monitor BP.Barnet Pall, RN,BSN 01/20/2019 4:10 PM   Dr. Fransico Him is Medical Director for Cardiac Rehab at [facility].

## 2019-01-21 ENCOUNTER — Encounter (HOSPITAL_COMMUNITY)
Admission: RE | Admit: 2019-01-21 | Discharge: 2019-01-21 | Disposition: A | Discharge status: Home / Self Care | Payer: Medicare Other | Source: Ambulatory Visit | Attending: Cardiovascular Disease | Admitting: Cardiovascular Disease

## 2019-01-21 ENCOUNTER — Other Ambulatory Visit: Payer: Self-pay

## 2019-01-21 ENCOUNTER — Encounter (HOSPITAL_COMMUNITY): Payer: Medicare Other

## 2019-01-21 DIAGNOSIS — Z955 Presence of coronary angioplasty implant and graft: Secondary | ICD-10-CM

## 2019-01-21 DIAGNOSIS — I2121 ST elevation (STEMI) myocardial infarction involving left circumflex coronary artery: Secondary | ICD-10-CM | POA: Diagnosis not present

## 2019-01-23 ENCOUNTER — Encounter (HOSPITAL_COMMUNITY)
Admission: RE | Admit: 2019-01-23 | Discharge: 2019-01-23 | Disposition: A | Discharge status: Home / Self Care | Payer: Medicare Other | Source: Ambulatory Visit | Attending: Cardiovascular Disease | Admitting: Cardiovascular Disease

## 2019-01-23 ENCOUNTER — Encounter (HOSPITAL_COMMUNITY): Payer: Medicare Other

## 2019-01-23 ENCOUNTER — Other Ambulatory Visit: Payer: Self-pay

## 2019-01-23 DIAGNOSIS — I2121 ST elevation (STEMI) myocardial infarction involving left circumflex coronary artery: Secondary | ICD-10-CM | POA: Diagnosis not present

## 2019-01-23 DIAGNOSIS — Z955 Presence of coronary angioplasty implant and graft: Secondary | ICD-10-CM

## 2019-01-23 NOTE — Progress Notes (Signed)
Marcus Peterson completed his first week of exercise. Intermittent exertional blood pressure elevations noted. Will send this weeks exercise flow sheets for Dr Burt Knack to review. Marcus Peterson is doing well with exercise.Barnet Pall, RN,BSN 01/23/2019 10:27 AM

## 2019-01-26 ENCOUNTER — Encounter (HOSPITAL_COMMUNITY): Payer: Medicare Other

## 2019-01-26 ENCOUNTER — Other Ambulatory Visit: Payer: Self-pay

## 2019-01-26 ENCOUNTER — Other Ambulatory Visit: Payer: Medicare Other | Admitting: *Deleted

## 2019-01-26 DIAGNOSIS — Z79899 Other long term (current) drug therapy: Secondary | ICD-10-CM

## 2019-01-26 LAB — HEPATIC FUNCTION PANEL
ALT: 25 IU/L (ref 0–44)
AST: 30 IU/L (ref 0–40)
Albumin: 4.1 g/dL (ref 3.8–4.8)
Alkaline Phosphatase: 73 IU/L (ref 39–117)
Bilirubin Total: 1.1 mg/dL (ref 0.0–1.2)
Bilirubin, Direct: 0.31 mg/dL (ref 0.00–0.40)
Total Protein: 8.3 g/dL (ref 6.0–8.5)

## 2019-01-26 LAB — LIPID PANEL
Chol/HDL Ratio: 3.4 ratio (ref 0.0–5.0)
Cholesterol, Total: 103 mg/dL (ref 100–199)
HDL: 30 mg/dL — ABNORMAL LOW (ref >39)
LDL Chol Calc (NIH): 46 mg/dL (ref 0–99)
Triglycerides: 158 mg/dL — ABNORMAL HIGH (ref 0–149)
VLDL Cholesterol Cal: 27 mg/dL (ref 5–40)

## 2019-01-28 ENCOUNTER — Encounter (HOSPITAL_COMMUNITY): Payer: Medicare Other

## 2019-01-28 ENCOUNTER — Other Ambulatory Visit: Payer: Self-pay

## 2019-01-28 ENCOUNTER — Encounter (HOSPITAL_COMMUNITY)
Admission: RE | Admit: 2019-01-28 | Discharge: 2019-01-28 | Disposition: A | Discharge status: Home / Self Care | Payer: Medicare Other | Source: Ambulatory Visit | Attending: Cardiovascular Disease | Admitting: Cardiovascular Disease

## 2019-01-28 DIAGNOSIS — I2121 ST elevation (STEMI) myocardial infarction involving left circumflex coronary artery: Secondary | ICD-10-CM | POA: Diagnosis not present

## 2019-01-28 DIAGNOSIS — Z955 Presence of coronary angioplasty implant and graft: Secondary | ICD-10-CM

## 2019-01-28 NOTE — Progress Notes (Signed)
I have reviewed a Home Exercise Prescription with [name] . Marcus Peterson is not currently exercising at home.  The patient was advised to walk 2-4 days a week for 30-45 minutes.  [name] and I discussed how to progress their exercise prescription.  The patient stated that their goals were to start walking 2-4 days for 30-45 minutes in addition to CR program.  The patient stated that they understand the exercise prescription.  We reviewed exercise guidelines, target heart rate during exercise, RPE Scale, weather conditions, NTG use, endpoints for exercise, warmup and cool down.  Patient is encouraged to come to me with any questions. I will continue to follow up with the patient to assist them with progression and safety.    Deitra Mayo BS, ACSM CEP 01/28/2019 10:42 AM

## 2019-01-29 NOTE — Progress Notes (Signed)
Cardiac Individual Treatment Plan  Patient Details  Name: Marcus Peterson MRN: [id number] Date of Birth: 1951-01-01 Referring Provider:     [facility] from 01/13/2019 in [place]  Referring Provider  Dr. Burt Knack      Initial Encounter Date:    CARDIAC REHAB PHASE II ORIENTATION from 01/13/2019 in [place]  Date  01/13/19      Visit Diagnosis: ST elevation myocardial infarction involving left circumflex coronary artery [facility])  Status post coronary artery stent placement  Patient's Home Medications on Admission:  Current Outpatient Medications:  .  aspirin EC 81 MG EC tablet, Take 1 tablet (81 mg total) by mouth daily., Disp: 30 tablet, Rfl: 11 .  atorvastatin (LIPITOR) 80 MG tablet, Take 1 tablet (80 mg total) by mouth daily at 6 PM., Disp: 30 tablet, Rfl: 6 .  benazepril (LOTENSIN) 20 MG tablet, Take 20 mg by mouth daily., Disp: , Rfl:  .  Calcium Carbonate-Vitamin D (CALCIUM + D PO), Take 1 tablet by mouth daily. , Disp: , Rfl:  .  folic acid (FOLVITE) 1 MG tablet, Take 1 mg by mouth daily., Disp: , Rfl:  .  inFLIXimab-abda (RENFLEXIS IV), Inject into the vein. Takes injection every 8 weeks for RA, Disp: , Rfl:  .  methotrexate 2.5 MG tablet, Take 17.5 mg by mouth once a week. On Fridays, Disp: , Rfl:  .  metoprolol succinate (TOPROL-XL) 50 MG 24 hr tablet, Take 50 mg by mouth daily. Take with or immediately following a meal., Disp: , Rfl:  .  Multiple Vitamin (MULTIVITAMIN) tablet, Take 1 tablet by mouth daily.  , Disp: , Rfl:  .  nitroGLYCERIN (NITROSTAT) 0.4 MG SL tablet, Place 0.4 mg under the tongue every 5 (five) minutes as needed for chest pain., Disp: , Rfl:  .  pantoprazole (PROTONIX) 40 MG tablet, Take 1 tablet (40 mg total) by mouth daily., Disp: 30 tablet, Rfl: 3 .  tadalafil (CIALIS) 20 MG tablet, Take 20 mg by mouth daily as needed for erectile dysfunction. PRN, Disp: , Rfl:  .   ticagrelor (BRILINTA) 90 MG TABS tablet, Take 1 tablet (90 mg total) by mouth 2 (two) times daily., Disp: 60 tablet, Rfl: 11  Past Medical History: Past Medical History:  Diagnosis Date  . Arthritis   . CAD in native artery    a. cath 11/2018 - Severe left circumflex/obtuse marginal stenosis s/p DES to both. 50% LAD>> medical therapy   . Cancer ([place])    melanoma on right arm   . Central serous choroidopathy   . Coronary artery disease   . Erectile dysfunction    Situation reaction  . GERD (gastroesophageal reflux disease)   . Hyperlipidemia   . Hypertension   . MI (myocardial infarction) ([place]) 12/03/2018  . Nonspecific abnormal unspecified cardiovascular function study   . Rheumatic arteritis   . Right wrist fracture   . Shingles 2010    Tobacco Use: Social History   Tobacco Use  Smoking Status Never Smoker  Smokeless Tobacco Former Systems developer  . Types: Chew    Labs: Recent Review Flowsheet Data    Labs for ITP Cardiac and Pulmonary Rehab Latest Ref Rng & Units 12/03/2018 12/04/2018 01/26/2019   Cholestrol 100 - 199 mg/dL - 136 103   LDLCALC 0 - 99 mg/dL - 59 46   HDL >39 mg/dL - 24(L) 30(L)   Trlycerides 0 - 149 mg/dL - 266(H) 158(H)  TCO2 22 - 32 mmol/L 18(L) - -      Capillary Blood Glucose: No results found for: GLUCAP   Exercise Target Goals: Exercise Program Goal: Individual exercise prescription set using results from initial 6 min walk test and THRR while considering  patient's activity barriers and safety.   Exercise Prescription Goal: Initial exercise prescription builds to 30-45 minutes a day of aerobic activity, 2-3 days per week.  Home exercise guidelines will be given to patient during program as part of exercise prescription that the participant will acknowledge.  Activity Barriers & Risk Stratification: Activity Barriers & Cardiac Risk Stratification - 01/13/19 1014      Activity Barriers & Cardiac Risk Stratification   Activity Barriers   Deconditioning;Left Knee Replacement;Neck/Spine Problems;Balance Concerns   Spinal Fusion   Cardiac Risk Stratification  High       6 Minute Walk: 6 Minute Walk    Row Name 01/13/19 1013         6 Minute Walk   Phase  Initial     Distance  1565 feet     Walk Time  6 minutes     # of Rest Breaks  0     MPH  2.9     METS  3.5     RPE  12     Perceived Dyspnea   0     VO2 Peak  12.59     Symptoms  No     Resting HR  68 bpm     Resting BP  162/80     Resting Oxygen Saturation   97 %     Exercise Oxygen Saturation  during 6 min walk  97 %     Max Ex. HR  107 bpm     Max Ex. BP  164/88     2 Minute Post BP  160/86        Oxygen Initial Assessment:   Oxygen Re-Evaluation:   Oxygen Discharge (Final Oxygen Re-Evaluation):   Initial Exercise Prescription: Initial Exercise Prescription - 01/13/19 1000      Date of Initial Exercise RX and Referring Provider   Date  01/13/19    Referring Provider  Dr. Burt Knack    Expected Discharge Date  03/13/19      Recumbant Bike   Level  2    Watts  35    Minutes  15    METs  3.1      NuStep   Level  3    SPM  85    Minutes  15    METs  2.9      Prescription Details   Frequency (times per week)  2    Duration  Progress to 30 minutes of continuous aerobic without signs/symptoms of physical distress      Intensity   THRR 40-80% of Max Heartrate  67-122    Ratings of Perceived Exertion  11-13      Progression   Progression  Continue to progress workloads to maintain intensity without signs/symptoms of physical distress.      Resistance Training   Training Prescription  Yes    Weight  4 lbs.     Reps  10-15       Perform Capillary Blood Glucose checks as needed.  Exercise Prescription Changes: Exercise Prescription Changes    Row Name 01/19/19 0902 01/28/19 1000           Response to Exercise   Blood Pressure (Admit)  140/72  152/72      Blood Pressure (Exercise)  170/74  150/76      Blood Pressure (Exit)   146/82  144/72      Heart Rate (Admit)  69 bpm  71 bpm      Heart Rate (Exercise)  95 bpm  89 bpm      Heart Rate (Exit)  69 bpm  71 bpm      Rating of Perceived Exertion (Exercise)  13  13      Symptoms  none  none      Comments  Patient tolerated 1st session of exercise well without symptoms.  -      Duration  Progress to 30 minutes of  aerobic without signs/symptoms of physical distress  Progress to 30 minutes of  aerobic without signs/symptoms of physical distress      Intensity  THRR unchanged  THRR unchanged        Progression   Progression  Continue to progress workloads to maintain intensity without signs/symptoms of physical distress.  Continue to progress workloads to maintain intensity without signs/symptoms of physical distress.      Average METs  2.4  2.5        Resistance Training   Training Prescription  Yes  No      Weight  4 lbs.   -      Reps  10-15  -      Time  10 Minutes  -        Interval Training   Interval Training  No  No        Recumbant Bike   Level  2  2      Minutes  15  15      METs  2  2.1        NuStep   Level  3  3      SPM  85  85      Minutes  15  15      METs  2.7  2.9        Home Exercise Plan   Plans to continue exercise at  -  Home (comment)      Frequency  -  Add 3 additional days to program exercise sessions.      Initial Home Exercises Provided  -  01/28/19         Exercise Comments: Exercise Comments    Row Name 01/19/19 0951 01/28/19 1030         Exercise Comments  Patient off to a good start with exercise. Tolerated low intensity exercise without symptoms.  Reviewed HEP, METs, and goals with Pt. Pt is progressing well.         Exercise Goals and Review: Exercise Goals    Row Name 01/13/19 1018             Exercise Goals   Increase Physical Activity  Yes       Intervention  Provide advice, education, support and counseling about physical activity/exercise needs.;Develop an individualized exercise prescription for  aerobic and resistive training based on initial evaluation findings, risk stratification, comorbidities and participant's personal goals.       Expected Outcomes  Short Term: Attend rehab on a regular basis to increase amount of physical activity.;Long Term: Add in home exercise to make exercise part of routine and to increase amount of physical activity.;Long Term: Exercising regularly at least 3-5 days a week.       Increase Strength  and Stamina  Yes       Intervention  Provide advice, education, support and counseling about physical activity/exercise needs.;Develop an individualized exercise prescription for aerobic and resistive training based on initial evaluation findings, risk stratification, comorbidities and participant's personal goals.       Expected Outcomes  Short Term: Increase workloads from initial exercise prescription for resistance, speed, and METs.;Short Term: Perform resistance training exercises routinely during rehab and add in resistance training at home;Long Term: Improve cardiorespiratory fitness, muscular endurance and strength as measured by increased METs and functional capacity (6MWT)       Able to understand and use rate of perceived exertion (RPE) scale  Yes       Intervention  Provide education and explanation on how to use RPE scale       Expected Outcomes  Short Term: Able to use RPE daily in rehab to express subjective intensity level;Long Term:  Able to use RPE to guide intensity level when exercising independently       Knowledge and understanding of Target Heart Rate Range (THRR)  Yes       Intervention  Provide education and explanation of THRR including how the numbers were predicted and where they are located for reference       Expected Outcomes  Short Term: Able to state/look up THRR;Long Term: Able to use THRR to govern intensity when exercising independently;Short Term: Able to use daily as guideline for intensity in rehab       Able to check pulse  independently  Yes       Intervention  Provide education and demonstration on how to check pulse in carotid and radial arteries.;Review the importance of being able to check your own pulse for safety during independent exercise       Expected Outcomes  Short Term: Able to explain why pulse checking is important during independent exercise;Long Term: Able to check pulse independently and accurately       Understanding of Exercise Prescription  Yes       Intervention  Provide education, explanation, and written materials on patient's individual exercise prescription       Expected Outcomes  Short Term: Able to explain program exercise prescription;Long Term: Able to explain home exercise prescription to exercise independently          Exercise Goals Re-Evaluation : Exercise Goals Re-Evaluation    Row Name 01/19/19 0951 01/28/19 1026           Exercise Goal Re-Evaluation   Exercise Goals Review  Increase Physical Activity;Able to understand and use rate of perceived exertion (RPE) scale  Increase Physical Activity;Increase Strength and Stamina;Able to understand and use rate of perceived exertion (RPE) scale;Knowledge and understanding of Target Heart Rate Range (THRR);Able to check pulse independently;Understanding of Exercise Prescription      Comments  Patient able to understand and use RPE scale appropriately.  Reviewed HEP, METs, and goals with Pt. Reviewed METs and goals with Pt. Pt is progressing well and has a MET level of 2.5. Pt understands goals, exercise Rx, THRR, RPE scale, pulse coutning, weather precautions, warm up and cool down stretches, and end points of exercise. Pt is not currently exercising at home. Encouraged Pt to start wlaking 2-4 days for 30-45 minutes per day in addition to CR program.      Expected Outcomes  Increase workloads as tolerated to help improve cardiorespiratory fitness.  Will continue to monitor and progress Pt as tolerated.  Discharge Exercise  Prescription (Final Exercise Prescription Changes): Exercise Prescription Changes - 01/28/19 1000      Response to Exercise   Blood Pressure (Admit)  152/72    Blood Pressure (Exercise)  150/76    Blood Pressure (Exit)  144/72    Heart Rate (Admit)  71 bpm    Heart Rate (Exercise)  89 bpm    Heart Rate (Exit)  71 bpm    Rating of Perceived Exertion (Exercise)  13    Symptoms  none    Duration  Progress to 30 minutes of  aerobic without signs/symptoms of physical distress    Intensity  THRR unchanged      Progression   Progression  Continue to progress workloads to maintain intensity without signs/symptoms of physical distress.    Average METs  2.5      Resistance Training   Training Prescription  No      Interval Training   Interval Training  No      Recumbant Bike   Level  2    Minutes  15    METs  2.1      NuStep   Level  3    SPM  85    Minutes  15    METs  2.9      Home Exercise Plan   Plans to continue exercise at  Home (comment)    Frequency  Add 3 additional days to program exercise sessions.    Initial Home Exercises Provided  01/28/19       Nutrition:  Target Goals: Understanding of nutrition guidelines, daily intake of sodium '1500mg'$ , cholesterol '200mg'$ , calories 30% from fat and 7% or less from saturated fats, daily to have 5 or more servings of fruits and vegetables.  Biometrics: Pre Biometrics - 01/13/19 1018      Pre Biometrics   Height  5' 9.5" (1.765 m)    Weight  100.7 kg    Waist Circumference  44.5 inches    Hip Circumference  45 inches    Waist to Hip Ratio  0.99 %    BMI (Calculated)  32.33    Triceps Skinfold  23 mm    % Body Fat  32.9 %    Grip Strength  36 kg    Flexibility  0 in    Single Leg Stand  5.37 seconds        Nutrition Therapy Plan and Nutrition Goals:   Nutrition Assessments:   Nutrition Goals Re-Evaluation:   Nutrition Goals Re-Evaluation:   Nutrition Goals Discharge (Final Nutrition Goals  Re-Evaluation):   Psychosocial: Target Goals: Acknowledge presence or absence of significant depression and/or stress, maximize coping skills, provide positive support system. Participant is able to verbalize types and ability to use techniques and skills needed for reducing stress and depression.  Initial Review & Psychosocial Screening: Initial Psych Review & Screening - 01/13/19 1110      Initial Review   Current issues with  None Identified      Family Dynamics   Good Support System?  Yes    Comments  No psychosocial barriers to participation in Cardiac Rehab identified. Patient has a strong support system of family, friends, and health care providers. He will continue to maintain a positive attitude and participate in health stress management if barriers to arise.      Barriers   Psychosocial barriers to participate in program  There are no identifiable barriers or psychosocial needs.      Screening  Interventions   Interventions  Encouraged to exercise       Quality of Life Scores: Quality of Life - 01/13/19 1029      Quality of Life   Select  Quality of Life      Quality of Life Scores   Health/Function Pre  23.87 %    Socioeconomic Pre  26.63 %    Psych/Spiritual Pre  23.57 %    Family Pre  27.3 %    GLOBAL Pre  24.93 %      Scores of 19 and below usually indicate a poorer quality of life in these areas.  A difference of  2-3 points is a clinically meaningful difference.  A difference of 2-3 points in the total score of the Quality of Life Index has been associated with significant improvement in overall quality of life, self-image, physical symptoms, and general health in studies assessing change in quality of life.  PHQ-9: Recent Review Flowsheet Data    Depression screen [facility] 2/9 01/13/2019   Decreased Interest 0   Down, Depressed, Hopeless 0   PHQ - 2 Score 0     Interpretation of Total Score  Total Score Depression Severity:  1-4 = Minimal depression, 5-9 =  Mild depression, 10-14 = Moderate depression, 15-19 = Moderately severe depression, 20-27 = Severe depression   Psychosocial Evaluation and Intervention:   Psychosocial Re-Evaluation: Psychosocial Re-Evaluation    [place] Name 01/29/19 1351             Psychosocial Re-Evaluation   Current issues with  None Identified       Interventions  Encouraged to attend Cardiac Rehabilitation for the exercise       Continue Psychosocial Services   No Follow up required          Psychosocial Discharge (Final Psychosocial Re-Evaluation): Psychosocial Re-Evaluation - 01/29/19 1351      Psychosocial Re-Evaluation   Current issues with  None Identified    Interventions  Encouraged to attend Cardiac Rehabilitation for the exercise    Continue Psychosocial Services   No Follow up required       Vocational Rehabilitation: Provide vocational rehab assistance to qualifying candidates.   Vocational Rehab Evaluation & Intervention: Vocational Rehab - 01/13/19 1110      Initial Vocational Rehab Evaluation & Intervention   Assessment shows need for Vocational Rehabilitation  No       Education: Education Goals: Education classes will be provided on a weekly basis, covering required topics. Participant will state understanding/return demonstration of topics presented.  Learning Barriers/Preferences: Learning Barriers/Preferences - 01/13/19 1022      Learning Barriers/Preferences   Learning Barriers  Sight;Hearing    Learning Preferences  Individual Instruction;Skilled Demonstration       Education Topics: Count Your Pulse:  -Group instruction provided by verbal instruction, demonstration, patient participation and written materials to support subject.  Instructors address importance of being able to find your pulse and how to count your pulse when at home without a heart monitor.  Patients get hands on experience counting their pulse with staff help and individually.   Heart Attack,  Angina, and Risk Factor Modification:  -Group instruction provided by verbal instruction, video, and written materials to support subject.  Instructors address signs and symptoms of angina and heart attacks.    Also discuss risk factors for heart disease and how to make changes to improve heart health risk factors.   Functional Fitness:  -Group instruction provided by verbal instruction, demonstration, patient  participation, and written materials to support subject.  Instructors address safety measures for doing things around the house.  Discuss how to get up and down off the floor, how to pick things up properly, how to safely get out of a chair without assistance, and balance training.   Meditation and Mindfulness:  -Group instruction provided by verbal instruction, patient participation, and written materials to support subject.  Instructor addresses importance of mindfulness and meditation practice to help reduce stress and improve awareness.  Instructor also leads participants through a meditation exercise.    Stretching for Flexibility and Mobility:  -Group instruction provided by verbal instruction, patient participation, and written materials to support subject.  Instructors lead participants through series of stretches that are designed to increase flexibility thus improving mobility.  These stretches are additional exercise for major muscle groups that are typically performed during regular warm up and cool down.   Hands Only CPR:  -Group verbal, video, and participation provides a basic overview of AHA guidelines for community CPR. Role-play of emergencies allow participants the opportunity to practice calling for help and chest compression technique with discussion of AED use.   Hypertension: -Group verbal and written instruction that provides a basic overview of hypertension including the most recent diagnostic guidelines, risk factor reduction with self-care instructions and  medication management.    Nutrition I class: Heart Healthy Eating:  -Group instruction provided by PowerPoint slides, verbal discussion, and written materials to support subject matter. The instructor gives an explanation and review of the Therapeutic Lifestyle Changes diet recommendations, which includes a discussion on lipid goals, dietary fat, sodium, fiber, plant stanol/sterol esters, sugar, and the components of a well-balanced, healthy diet.   Nutrition II class: Lifestyle Skills:  -Group instruction provided by PowerPoint slides, verbal discussion, and written materials to support subject matter. The instructor gives an explanation and review of label reading, grocery shopping for heart health, heart healthy recipe modifications, and ways to make healthier choices when eating out.   Diabetes Question & Answer:  -Group instruction provided by PowerPoint slides, verbal discussion, and written materials to support subject matter. The instructor gives an explanation and review of diabetes co-morbidities, pre- and post-prandial blood glucose goals, pre-exercise blood glucose goals, signs, symptoms, and treatment of hypoglycemia and hyperglycemia, and foot care basics.   Diabetes Blitz:  -Group instruction provided by PowerPoint slides, verbal discussion, and written materials to support subject matter. The instructor gives an explanation and review of the physiology behind type 1 and type 2 diabetes, diabetes medications and rational behind using different medications, pre- and post-prandial blood glucose recommendations and Hemoglobin A1c goals, diabetes diet, and exercise including blood glucose guidelines for exercising safely.    Portion Distortion:  -Group instruction provided by PowerPoint slides, verbal discussion, written materials, and food models to support subject matter. The instructor gives an explanation of serving size versus portion size, changes in portions sizes over the last  20 years, and what consists of a serving from each food group.   Stress Management:  -Group instruction provided by verbal instruction, video, and written materials to support subject matter.  Instructors review role of stress in heart disease and how to cope with stress positively.     Exercising on Your Own:  -Group instruction provided by verbal instruction, power point, and written materials to support subject.  Instructors discuss benefits of exercise, components of exercise, frequency and intensity of exercise, and end points for exercise.  Also discuss use of nitroglycerin and activating EMS.  Review options of places to exercise outside of rehab.  Review guidelines for sex with heart disease.   Cardiac Drugs I:  -Group instruction provided by verbal instruction and written materials to support subject.  Instructor reviews cardiac drug classes: antiplatelets, anticoagulants, beta blockers, and statins.  Instructor discusses reasons, side effects, and lifestyle considerations for each drug class.   Cardiac Drugs II:  -Group instruction provided by verbal instruction and written materials to support subject.  Instructor reviews cardiac drug classes: angiotensin converting enzyme inhibitors (ACE-I), angiotensin II receptor blockers (ARBs), nitrates, and calcium channel blockers.  Instructor discusses reasons, side effects, and lifestyle considerations for each drug class.   Anatomy and Physiology of the Circulatory System:  Group verbal and written instruction and models provide basic cardiac anatomy and physiology, with the coronary electrical and arterial systems. Review of: AMI, Angina, Valve disease, Heart Failure, Peripheral Artery Disease, Cardiac Arrhythmia, Pacemakers, and the ICD.   Other Education:  -Group or individual verbal, written, or video instructions that support the educational goals of the cardiac rehab program.   Holiday Eating Survival Tips:  -Group instruction  provided by PowerPoint slides, verbal discussion, and written materials to support subject matter. The instructor gives patients tips, tricks, and techniques to help them not only survive but enjoy the holidays despite the onslaught of food that accompanies the holidays.   Knowledge Questionnaire Score: Knowledge Questionnaire Score - 01/13/19 1025      Knowledge Questionnaire Score   Pre Score  15/28       Core Components/Risk Factors/Patient Goals at Admission: Personal Goals and Risk Factors at Admission - 01/13/19 1020      Core Components/Risk Factors/Patient Goals on Admission    Weight Management  Yes;Obesity;Weight Maintenance;Weight Loss    Intervention  Weight Management: Develop a combined nutrition and exercise program designed to reach desired caloric intake, while maintaining appropriate intake of nutrient and fiber, sodium and fats, and appropriate energy expenditure required for the weight goal.;Weight Management: Provide education and appropriate resources to help participant work on and attain dietary goals.;Weight Management/Obesity: Establish reasonable short term and long term weight goals.;Obesity: Provide education and appropriate resources to help participant work on and attain dietary goals.    Admit Weight  222 lb 0.1 oz (100.7 kg)    Expected Outcomes  Short Term: Continue to assess and modify interventions until short term weight is achieved;Long Term: Adherence to nutrition and physical activity/exercise program aimed toward attainment of established weight goal;Weight Maintenance: Understanding of the daily nutrition guidelines, which includes 25-35% calories from fat, 7% or less cal from saturated fats, less than '200mg'$  cholesterol, less than 1.5gm of sodium, & 5 or more servings of fruits and vegetables daily;Weight Loss: Understanding of general recommendations for a balanced deficit meal plan, which promotes 1-2 lb weight loss per week and includes a negative energy  balance of [id number] kcal/d;Understanding recommendations for meals to include 15-35% energy as protein, 25-35% energy from fat, 35-60% energy from carbohydrates, less than '200mg'$  of dietary cholesterol, 20-35 gm of total fiber daily;Understanding of distribution of calorie intake throughout the day with the consumption of 4-5 meals/snacks    Hypertension  Yes    Intervention  Provide education on lifestyle modifcations including regular physical activity/exercise, weight management, moderate sodium restriction and increased consumption of fresh fruit, vegetables, and low fat dairy, alcohol moderation, and smoking cessation.;Monitor prescription use compliance.    Expected Outcomes  Short Term: Continued assessment and intervention until BP is < 140/44m HG in hypertensive  participants. < 130/82m HG in hypertensive participants with diabetes, heart failure or chronic kidney disease.;Long Term: Maintenance of blood pressure at goal levels.    Lipids  Yes    Intervention  Provide education and support for participant on nutrition & aerobic/resistive exercise along with prescribed medications to achieve LDL '70mg'$ , HDL >'40mg'$ .    Expected Outcomes  Short Term: Participant states understanding of desired cholesterol values and is compliant with medications prescribed. Participant is following exercise prescription and nutrition guidelines.;Long Term: Cholesterol controlled with medications as prescribed, with individualized exercise RX and with personalized nutrition plan. Value goals: LDL < '70mg'$ , HDL > 40 mg.       Core Components/Risk Factors/Patient Goals Review:  Goals and Risk Factor Review    Row Name 01/20/19 1611 01/29/19 1351 01/29/19 1353         Core Components/Risk Factors/Patient Goals Review   Personal Goals Review  Weight Management/Obesity;Lipids;Hypertension;Tobacco Cessation  Weight Management/Obesity;Lipids;Hypertension;Tobacco Cessation  -     Review  Patient started exercise on  01/19/19 will continue to montior BP as moderate resting blood pressure elevations noted  Marcus Peterson off to a good start to exercise at cardiac rehab. Marcus Peterson vital signs have been stable.  Marcus Peterson off to a good start to exercise at cardiac rehab. Marcus Peterson has had some moderate resting and exertional blood pressure elevations. Dr CBurt Knackhas been notified via fax     Expected Outcomes  Patient will continue to partcipate in cardiac rehab for exercise, nutrition and lifestyle modifications  Patient will continue to partcipate in cardiac rehab for exercise, nutrition and lifestyle modifications  Patient will continue to partcipate in cardiac rehab for exercise, nutrition and lifestyle modifications        Core Components/Risk Factors/Patient Goals at Discharge (Final Review):  Goals and Risk Factor Review - 01/29/19 1353      Core Components/Risk Factors/Patient Goals Review   Review  Marcus Peterson off to a good start to exercise at cardiac rehab. Marcus Peterson has had some moderate resting and exertional blood pressure elevations. Dr CBurt Knackhas been notified via fax    Expected Outcomes  Patient will continue to partcipate in cardiac rehab for exercise, nutrition and lifestyle modifications       ITP Comments: ITP Comments    Row Name 01/13/19 1109 01/20/19 1613 01/29/19 1350       ITP Comments  Dr. TFransico Him Cardiac Rehab Medical Director  30 Day ITP Review. Patien first day of exercise was on 01/19/19  30 Day ITP Review. Marcus Peterson off to a good start to exercise.        Comments: See ITP comments.MBarnet Pall RN,BSN 01/29/2019 1:56 PM

## 2019-01-30 ENCOUNTER — Other Ambulatory Visit: Payer: Self-pay

## 2019-01-30 ENCOUNTER — Encounter (HOSPITAL_COMMUNITY): Payer: Medicare Other

## 2019-01-30 ENCOUNTER — Encounter (HOSPITAL_COMMUNITY)
Admission: RE | Admit: 2019-01-30 | Discharge: 2019-01-30 | Disposition: A | Discharge status: Home / Self Care | Payer: Medicare Other | Source: Ambulatory Visit | Attending: Cardiovascular Disease | Admitting: Cardiovascular Disease

## 2019-01-30 ENCOUNTER — Telehealth: Payer: Self-pay | Admitting: *Deleted

## 2019-01-30 DIAGNOSIS — I2121 ST elevation (STEMI) myocardial infarction involving left circumflex coronary artery: Secondary | ICD-10-CM

## 2019-01-30 DIAGNOSIS — Z955 Presence of coronary angioplasty implant and graft: Secondary | ICD-10-CM

## 2019-01-30 NOTE — Telephone Encounter (Signed)
Call placed to pt re: lab results.  Left a message for pt to call back.  

## 2019-02-02 ENCOUNTER — Encounter (HOSPITAL_COMMUNITY)
Admission: RE | Admit: 2019-02-02 | Discharge: 2019-02-02 | Disposition: A | Discharge status: Home / Self Care | Payer: Medicare Other | Source: Ambulatory Visit | Attending: Cardiovascular Disease | Admitting: Cardiovascular Disease

## 2019-02-02 ENCOUNTER — Telehealth: Payer: Self-pay

## 2019-02-02 ENCOUNTER — Encounter (HOSPITAL_COMMUNITY): Payer: Medicare Other

## 2019-02-02 ENCOUNTER — Other Ambulatory Visit: Payer: Self-pay

## 2019-02-02 DIAGNOSIS — Z955 Presence of coronary angioplasty implant and graft: Secondary | ICD-10-CM

## 2019-02-02 DIAGNOSIS — I2121 ST elevation (STEMI) myocardial infarction involving left circumflex coronary artery: Secondary | ICD-10-CM

## 2019-02-02 NOTE — Telephone Encounter (Signed)
Received message from Cardiac Rehab last week to review BP readings with Dr. Burt Knack as they were elevated (see Cardiac Rehab report).  Per Dr. Burt Knack, called to instruct patient to INCREASE BENAZEPRIL to 40 mg daily.  Attempted to call patient several times and the line was busy.  Will try again later.

## 2019-02-02 NOTE — Telephone Encounter (Signed)
Left message to call back  

## 2019-02-03 MED ORDER — BENAZEPRIL HCL 40 MG PO TABS: 40.0000 mg | ORAL_TABLET | Freq: Every day | ORAL | 3 refills | Status: DC

## 2019-02-03 NOTE — Telephone Encounter (Signed)
Spoke with pt and went over recommendations.  Pt asked that new dose be sent to [facility].  Says he has plenty of 20s to take 2 and last until he gets the new prescription.  Advised pt to monitor BP and when appropriate to contact the office about abnormal numbers.  Pt verbalized understanding and was appreciative for call.

## 2019-02-03 NOTE — Telephone Encounter (Signed)
Follow up ° ° °Patient is returning call per the previous message. Please call. °

## 2019-02-04 ENCOUNTER — Encounter: Payer: Self-pay | Admitting: *Deleted

## 2019-02-04 ENCOUNTER — Encounter (HOSPITAL_COMMUNITY)
Admission: RE | Admit: 2019-02-04 | Discharge: 2019-02-04 | Disposition: A | Discharge status: Home / Self Care | Payer: Medicare Other | Source: Ambulatory Visit | Attending: Cardiovascular Disease | Admitting: Cardiovascular Disease

## 2019-02-04 ENCOUNTER — Encounter (HOSPITAL_COMMUNITY): Payer: Medicare Other

## 2019-02-04 ENCOUNTER — Other Ambulatory Visit: Payer: Self-pay

## 2019-02-04 DIAGNOSIS — I2121 ST elevation (STEMI) myocardial infarction involving left circumflex coronary artery: Secondary | ICD-10-CM

## 2019-02-04 DIAGNOSIS — Z955 Presence of coronary angioplasty implant and graft: Secondary | ICD-10-CM

## 2019-02-04 NOTE — Telephone Encounter (Signed)
3rd attempt to reach pt re: lab results.  Left a message for pt to call back.  Letter is being mailed to pt re: results as well.

## 2019-02-06 ENCOUNTER — Other Ambulatory Visit: Payer: Self-pay

## 2019-02-06 ENCOUNTER — Encounter (HOSPITAL_COMMUNITY)
Admission: RE | Admit: 2019-02-06 | Discharge: 2019-02-06 | Disposition: A | Discharge status: Home / Self Care | Payer: Medicare Other | Source: Ambulatory Visit | Attending: Cardiovascular Disease | Admitting: Cardiovascular Disease

## 2019-02-06 ENCOUNTER — Encounter (HOSPITAL_COMMUNITY): Payer: Medicare Other

## 2019-02-06 DIAGNOSIS — I2121 ST elevation (STEMI) myocardial infarction involving left circumflex coronary artery: Secondary | ICD-10-CM

## 2019-02-06 DIAGNOSIS — Z955 Presence of coronary angioplasty implant and graft: Secondary | ICD-10-CM

## 2019-02-09 ENCOUNTER — Other Ambulatory Visit: Payer: Self-pay

## 2019-02-09 ENCOUNTER — Encounter (HOSPITAL_COMMUNITY): Payer: Medicare Other

## 2019-02-09 ENCOUNTER — Encounter (HOSPITAL_COMMUNITY)
Admission: RE | Admit: 2019-02-09 | Discharge: 2019-02-09 | Disposition: A | Discharge status: Home / Self Care | Payer: Medicare Other | Source: Ambulatory Visit | Attending: Cardiovascular Disease | Admitting: Cardiovascular Disease

## 2019-02-09 DIAGNOSIS — Z955 Presence of coronary angioplasty implant and graft: Secondary | ICD-10-CM

## 2019-02-09 DIAGNOSIS — I2121 ST elevation (STEMI) myocardial infarction involving left circumflex coronary artery: Secondary | ICD-10-CM | POA: Diagnosis not present

## 2019-02-11 ENCOUNTER — Other Ambulatory Visit: Payer: Self-pay

## 2019-02-11 ENCOUNTER — Encounter (HOSPITAL_COMMUNITY): Payer: Medicare Other

## 2019-02-11 ENCOUNTER — Encounter (HOSPITAL_COMMUNITY)
Admission: RE | Admit: 2019-02-11 | Discharge: 2019-02-11 | Disposition: A | Discharge status: Home / Self Care | Payer: Medicare Other | Source: Ambulatory Visit | Attending: Cardiovascular Disease | Admitting: Cardiovascular Disease

## 2019-02-11 DIAGNOSIS — Z955 Presence of coronary angioplasty implant and graft: Secondary | ICD-10-CM

## 2019-02-11 DIAGNOSIS — I2121 ST elevation (STEMI) myocardial infarction involving left circumflex coronary artery: Secondary | ICD-10-CM | POA: Diagnosis not present

## 2019-02-13 ENCOUNTER — Encounter (HOSPITAL_COMMUNITY): Payer: Medicare Other

## 2019-02-13 ENCOUNTER — Encounter (HOSPITAL_COMMUNITY)
Admission: RE | Admit: 2019-02-13 | Discharge: 2019-02-13 | Disposition: A | Discharge status: Home / Self Care | Payer: Medicare Other | Source: Ambulatory Visit | Attending: Cardiovascular Disease | Admitting: Cardiovascular Disease

## 2019-02-13 ENCOUNTER — Other Ambulatory Visit: Payer: Self-pay

## 2019-02-13 DIAGNOSIS — I2121 ST elevation (STEMI) myocardial infarction involving left circumflex coronary artery: Secondary | ICD-10-CM | POA: Diagnosis not present

## 2019-02-13 DIAGNOSIS — Z955 Presence of coronary angioplasty implant and graft: Secondary | ICD-10-CM

## 2019-02-16 ENCOUNTER — Other Ambulatory Visit: Payer: Self-pay

## 2019-02-16 ENCOUNTER — Encounter (HOSPITAL_COMMUNITY)
Admission: RE | Admit: 2019-02-16 | Discharge: 2019-02-16 | Disposition: A | Discharge status: Home / Self Care | Payer: Medicare Other | Source: Ambulatory Visit | Attending: Cardiovascular Disease | Admitting: Cardiovascular Disease

## 2019-02-16 ENCOUNTER — Encounter (HOSPITAL_COMMUNITY): Payer: Medicare Other

## 2019-02-16 DIAGNOSIS — Z955 Presence of coronary angioplasty implant and graft: Secondary | ICD-10-CM | POA: Insufficient documentation

## 2019-02-16 DIAGNOSIS — I2121 ST elevation (STEMI) myocardial infarction involving left circumflex coronary artery: Secondary | ICD-10-CM

## 2019-02-18 ENCOUNTER — Other Ambulatory Visit: Payer: Self-pay

## 2019-02-18 ENCOUNTER — Encounter (HOSPITAL_COMMUNITY)
Admission: RE | Admit: 2019-02-18 | Discharge: 2019-02-18 | Disposition: A | Discharge status: Home / Self Care | Payer: Medicare Other | Source: Ambulatory Visit | Attending: Cardiovascular Disease | Admitting: Cardiovascular Disease

## 2019-02-18 ENCOUNTER — Encounter (HOSPITAL_COMMUNITY): Payer: Medicare Other

## 2019-02-18 DIAGNOSIS — I2121 ST elevation (STEMI) myocardial infarction involving left circumflex coronary artery: Secondary | ICD-10-CM | POA: Diagnosis not present

## 2019-02-18 DIAGNOSIS — Z955 Presence of coronary angioplasty implant and graft: Secondary | ICD-10-CM

## 2019-02-20 ENCOUNTER — Encounter (HOSPITAL_COMMUNITY)
Admission: RE | Admit: 2019-02-20 | Discharge: 2019-02-20 | Disposition: A | Discharge status: Home / Self Care | Payer: Medicare Other | Source: Ambulatory Visit | Attending: Cardiovascular Disease | Admitting: Cardiovascular Disease

## 2019-02-20 ENCOUNTER — Other Ambulatory Visit: Payer: Self-pay

## 2019-02-20 ENCOUNTER — Encounter (HOSPITAL_COMMUNITY): Payer: Medicare Other

## 2019-02-20 DIAGNOSIS — I2121 ST elevation (STEMI) myocardial infarction involving left circumflex coronary artery: Secondary | ICD-10-CM | POA: Diagnosis not present

## 2019-02-20 DIAGNOSIS — Z955 Presence of coronary angioplasty implant and graft: Secondary | ICD-10-CM

## 2019-02-23 ENCOUNTER — Encounter (HOSPITAL_COMMUNITY)
Admission: RE | Admit: 2019-02-23 | Discharge: 2019-02-23 | Disposition: A | Discharge status: Home / Self Care | Payer: Medicare Other | Source: Ambulatory Visit | Attending: Cardiovascular Disease | Admitting: Cardiovascular Disease

## 2019-02-23 ENCOUNTER — Encounter (HOSPITAL_COMMUNITY): Payer: Medicare Other

## 2019-02-23 ENCOUNTER — Other Ambulatory Visit: Payer: Self-pay

## 2019-02-23 DIAGNOSIS — I2121 ST elevation (STEMI) myocardial infarction involving left circumflex coronary artery: Secondary | ICD-10-CM

## 2019-02-23 DIAGNOSIS — Z955 Presence of coronary angioplasty implant and graft: Secondary | ICD-10-CM

## 2019-02-23 NOTE — Progress Notes (Signed)
Cardiac Individual Treatment Plan  Patient Details  Name: Marcus Peterson MRN: [id number] Date of Birth: 1951/02/17 Referring Provider:     [facility] from 01/13/2019 in [place]  Referring Provider  Dr. Burt Knack      Initial Encounter Date:    CARDIAC REHAB PHASE II ORIENTATION from 01/13/2019 in [place]  Date  01/13/19      Visit Diagnosis: ST elevation myocardial infarction involving left circumflex coronary artery [facility])  Status post coronary artery stent placement  Patient's Home Medications on Admission:  Current Outpatient Medications:  .  aspirin EC 81 MG EC tablet, Take 1 tablet (81 mg total) by mouth daily., Disp: 30 tablet, Rfl: 11 .  atorvastatin (LIPITOR) 80 MG tablet, Take 1 tablet (80 mg total) by mouth daily at 6 PM., Disp: 30 tablet, Rfl: 6 .  benazepril (LOTENSIN) 40 MG tablet, Take 1 tablet (40 mg total) by mouth daily., Disp: 90 tablet, Rfl: 3 .  Calcium Carbonate-Vitamin D (CALCIUM + D PO), Take 1 tablet by mouth daily. , Disp: , Rfl:  .  folic acid (FOLVITE) 1 MG tablet, Take 1 mg by mouth daily., Disp: , Rfl:  .  inFLIXimab-abda (RENFLEXIS IV), Inject into the vein. Takes injection every 8 weeks for RA, Disp: , Rfl:  .  methotrexate 2.5 MG tablet, Take 17.5 mg by mouth once a week. On Fridays, Disp: , Rfl:  .  metoprolol succinate (TOPROL-XL) 50 MG 24 hr tablet, Take 50 mg by mouth daily. Take with or immediately following a meal., Disp: , Rfl:  .  Multiple Vitamin (MULTIVITAMIN) tablet, Take 1 tablet by mouth daily.  , Disp: , Rfl:  .  nitroGLYCERIN (NITROSTAT) 0.4 MG SL tablet, Place 0.4 mg under the tongue every 5 (five) minutes as needed for chest pain., Disp: , Rfl:  .  pantoprazole (PROTONIX) 40 MG tablet, Take 1 tablet (40 mg total) by mouth daily., Disp: 30 tablet, Rfl: 3 .  tadalafil (CIALIS) 20 MG tablet, Take 20 mg by mouth daily as needed for erectile  dysfunction. PRN, Disp: , Rfl:  .  ticagrelor (BRILINTA) 90 MG TABS tablet, Take 1 tablet (90 mg total) by mouth 2 (two) times daily., Disp: 60 tablet, Rfl: 11  Past Medical History: Past Medical History:  Diagnosis Date  . Arthritis   . CAD in native artery    a. cath 11/2018 - Severe left circumflex/obtuse marginal stenosis s/p DES to both. 50% LAD>> medical therapy   . Cancer ([place])    melanoma on right arm   . Central serous choroidopathy   . Coronary artery disease   . Erectile dysfunction    Situation reaction  . GERD (gastroesophageal reflux disease)   . Hyperlipidemia   . Hypertension   . MI (myocardial infarction) ([place]) 12/03/2018  . Nonspecific abnormal unspecified cardiovascular function study   . Rheumatic arteritis   . Right wrist fracture   . Shingles 2010    Tobacco Use: Social History   Tobacco Use  Smoking Status Never Smoker  Smokeless Tobacco Former Systems developer  . Types: Chew    Labs: Recent Review Flowsheet Data    Labs for ITP Cardiac and Pulmonary Rehab Latest Ref Rng & Units 12/03/2018 12/04/2018 01/26/2019   Cholestrol 100 - 199 mg/dL - 136 103   LDLCALC 0 - 99 mg/dL - 59 46   HDL >39 mg/dL - 24(L) 30(L)   Trlycerides 0 - 149  mg/dL - 266(H) 158(H)   TCO2 22 - 32 mmol/L 18(L) - -      Capillary Blood Glucose: No results found for: GLUCAP   Exercise Target Goals: Exercise Program Goal: Individual exercise prescription set using results from initial 6 min walk test and THRR while considering  patient's activity barriers and safety.   Exercise Prescription Goal: Starting with aerobic activity 30 plus minutes a day, 3 days per week for initial exercise prescription. Provide home exercise prescription and guidelines that participant acknowledges understanding prior to discharge.  Activity Barriers & Risk Stratification: Activity Barriers & Cardiac Risk Stratification - 01/13/19 1014      Activity Barriers & Cardiac Risk Stratification   Activity  Barriers  Deconditioning;Left Knee Replacement;Neck/Spine Problems;Balance Concerns   Spinal Fusion   Cardiac Risk Stratification  High       6 Minute Walk: 6 Minute Walk    Row Name 01/13/19 1013         6 Minute Walk   Phase  Initial     Distance  1565 feet     Walk Time  6 minutes     # of Rest Breaks  0     MPH  2.9     METS  3.5     RPE  12     Perceived Dyspnea   0     VO2 Peak  12.59     Symptoms  No     Resting HR  68 bpm     Resting BP  162/80     Resting Oxygen Saturation   97 %     Exercise Oxygen Saturation  during 6 min walk  97 %     Max Ex. HR  107 bpm     Max Ex. BP  164/88     2 Minute Post BP  160/86        Oxygen Initial Assessment:   Oxygen Re-Evaluation:   Oxygen Discharge (Final Oxygen Re-Evaluation):   Initial Exercise Prescription: Initial Exercise Prescription - 01/13/19 1000      Date of Initial Exercise RX and Referring Provider   Date  01/13/19    Referring Provider  Dr. Burt Knack    Expected Discharge Date  03/13/19      Recumbant Bike   Level  2    Watts  35    Minutes  15    METs  3.1      NuStep   Level  3    SPM  85    Minutes  15    METs  2.9      Prescription Details   Frequency (times per week)  2    Duration  Progress to 30 minutes of continuous aerobic without signs/symptoms of physical distress      Intensity   THRR 40-80% of Max Heartrate  67-122    Ratings of Perceived Exertion  11-13      Progression   Progression  Continue to progress workloads to maintain intensity without signs/symptoms of physical distress.      Resistance Training   Training Prescription  Yes    Weight  4 lbs.     Reps  10-15       Perform Capillary Blood Glucose checks as needed.  Exercise Prescription Changes:  Exercise Prescription Changes    Row Name 01/19/19 0902 01/28/19 1000 02/16/19 0900         Response to Exercise   Blood Pressure (Admit)  140/72  152/72  140/74     Blood Pressure (Exercise)  170/74  150/76   154/80     Blood Pressure (Exit)  146/82  144/72  126/74     Heart Rate (Admit)  69 bpm  71 bpm  67 bpm     Heart Rate (Exercise)  95 bpm  89 bpm  101 bpm     Heart Rate (Exit)  69 bpm  71 bpm  72 bpm     Rating of Perceived Exertion (Exercise)  _0 Symptoms  none  none  None     Comments  Patient tolerated 1st session of exercise well without symptoms.  -  -     Duration  Progress to 30 minutes of  aerobic without signs/symptoms of physical distress  Progress to 30 minutes of  aerobic without signs/symptoms of physical distress  Progress to 30 minutes of  aerobic without signs/symptoms of physical distress     Intensity  THRR unchanged  THRR unchanged  THRR unchanged       Progression   Progression  Continue to progress workloads to maintain intensity without signs/symptoms of physical distress.  Continue to progress workloads to maintain intensity without signs/symptoms of physical distress.  Continue to progress workloads to maintain intensity without signs/symptoms of physical distress.     Average METs  2.4  2.5  3.3       Resistance Training   Training Prescription  Yes  No  Yes     Weight  4 lbs.   -  4 lbs.      Reps  10-15  -  10-15     Time  10 Minutes  -  10 Minutes       Interval Training   Interval Training  No  No  No       Recumbant Bike   Level  _1 Minutes  _2 METs  2  2.1  2.6       NuStep   Level  _3 SPM  85  85  85     Minutes  _4 METs  2.7  2.9  4       Home Exercise Plan   Plans to continue exercise at  -  Home (comment)  Home (comment)     Frequency  -  Add 3 additional days to program exercise sessions.  Add 3 additional days to program exercise sessions.     Initial Home Exercises Provided  -  01/28/19  01/28/19        Exercise Comments:  Exercise Comments    Row Name 01/19/19 3903 01/28/19 1030 02/26/19 0825       Exercise Comments  Patient off to a good start with exercise. Tolerated low  intensity exercise without symptoms.  Reviewed HEP, METs, and goals with Pt. Pt is progressing well.  Reviewed METs and goals with Pt. Pt is progressing well.        Exercise Goals and Review:  Exercise Goals    Row Name 01/13/19 1018             Exercise Goals   Increase Physical Activity  Yes       Intervention  Provide advice, education, support and counseling about physical activity/exercise needs.;Develop an individualized  exercise prescription for aerobic and resistive training based on initial evaluation findings, risk stratification, comorbidities and participant's personal goals.       Expected Outcomes  Short Term: Attend rehab on a regular basis to increase amount of physical activity.;Long Term: Add in home exercise to make exercise part of routine and to increase amount of physical activity.;Long Term: Exercising regularly at least 3-5 days a week.       Increase Strength and Stamina  Yes       Intervention  Provide advice, education, support and counseling about physical activity/exercise needs.;Develop an individualized exercise prescription for aerobic and resistive training based on initial evaluation findings, risk stratification, comorbidities and participant's personal goals.       Expected Outcomes  Short Term: Increase workloads from initial exercise prescription for resistance, speed, and METs.;Short Term: Perform resistance training exercises routinely during rehab and add in resistance training at home;Long Term: Improve cardiorespiratory fitness, muscular endurance and strength as measured by increased METs and functional capacity (6MWT)       Able to understand and use rate of perceived exertion (RPE) scale  Yes       Intervention  Provide education and explanation on how to use RPE scale       Expected Outcomes  Short Term: Able to use RPE daily in rehab to express subjective intensity level;Long Term:  Able to use RPE to guide intensity level when exercising  independently       Knowledge and understanding of Target Heart Rate Range (THRR)  Yes       Intervention  Provide education and explanation of THRR including how the numbers were predicted and where they are located for reference       Expected Outcomes  Short Term: Able to state/look up THRR;Long Term: Able to use THRR to govern intensity when exercising independently;Short Term: Able to use daily as guideline for intensity in rehab       Able to check pulse independently  Yes       Intervention  Provide education and demonstration on how to check pulse in carotid and radial arteries.;Review the importance of being able to check your own pulse for safety during independent exercise       Expected Outcomes  Short Term: Able to explain why pulse checking is important during independent exercise;Long Term: Able to check pulse independently and accurately       Understanding of Exercise Prescription  Yes       Intervention  Provide education, explanation, and written materials on patient's individual exercise prescription       Expected Outcomes  Short Term: Able to explain program exercise prescription;Long Term: Able to explain home exercise prescription to exercise independently          Exercise Goals Re-Evaluation : Exercise Goals Re-Evaluation    Row Name 01/19/19 0951 01/28/19 1026 02/25/19 0900         Exercise Goal Re-Evaluation   Exercise Goals Review  Increase Physical Activity;Able to understand and use rate of perceived exertion (RPE) scale  Increase Physical Activity;Increase Strength and Stamina;Able to understand and use rate of perceived exertion (RPE) scale;Knowledge and understanding of Target Heart Rate Range (THRR);Able to check pulse independently;Understanding of Exercise Prescription  Increase Physical Activity;Increase Strength and Stamina;Able to understand and use rate of perceived exertion (RPE) scale;Knowledge and understanding of Target Heart Rate Range (THRR);Able to  check pulse independently;Understanding of Exercise Prescription     Comments  Patient able to understand  and use RPE scale appropriately.  Reviewed HEP, METs, and goals with Pt. Reviewed METs and goals with Pt. Pt is progressing well and has a MET level of 2.5. Pt understands goals, exercise Rx, THRR, RPE scale, pulse coutning, weather precautions, warm up and cool down stretches, and end points of exercise. Pt is not currently exercising at home. Encouraged Pt to start wlaking 2-4 days for 30-45 minutes per day in addition to CR program.  Reviewed METs and goals with Pt. Pt is progressing well and has a MET level of 3.3. Pt continues to increase workloads. Pt is walking at home for 30 minutes 2-4 days per week in addition to CR program.     Expected Outcomes  Increase workloads as tolerated to help improve cardiorespiratory fitness.  Will continue to monitor and progress Pt as tolerated.  Will continue to monitor and progress Pt as tolerated.         Discharge Exercise Prescription (Final Exercise Prescription Changes): Exercise Prescription Changes - 02/16/19 0900      Response to Exercise   Blood Pressure (Admit)  140/74    Blood Pressure (Exercise)  154/80    Blood Pressure (Exit)  126/74    Heart Rate (Admit)  67 bpm    Heart Rate (Exercise)  101 bpm    Heart Rate (Exit)  72 bpm    Rating of Perceived Exertion (Exercise)  13    Symptoms  None    Duration  Progress to 30 minutes of  aerobic without signs/symptoms of physical distress    Intensity  THRR unchanged      Progression   Progression  Continue to progress workloads to maintain intensity without signs/symptoms of physical distress.    Average METs  3.3      Resistance Training   Training Prescription  Yes    Weight  4 lbs.     Reps  10-15    Time  10 Minutes      Interval Training   Interval Training  No      Recumbant Bike   Level  3    Minutes  15    METs  2.6      NuStep   Level  4    SPM  85    Minutes  15     METs  4      Home Exercise Plan   Plans to continue exercise at  Home (comment)    Frequency  Add 3 additional days to program exercise sessions.    Initial Home Exercises Provided  01/28/19       Nutrition:  Target Goals: Understanding of nutrition guidelines, daily intake of sodium <1580m, cholesterol <2047m calories 30% from fat and 7% or less from saturated fats, daily to have 5 or more servings of fruits and vegetables.  Biometrics: Pre Biometrics - 01/13/19 1018      Pre Biometrics   Height  5' 9.5" (1.765 m)    Weight  100.7 kg    Waist Circumference  44.5 inches    Hip Circumference  45 inches    Waist to Hip Ratio  0.99 %    BMI (Calculated)  32.33    Triceps Skinfold  23 mm    % Body Fat  32.9 %    Grip Strength  36 kg    Flexibility  0 in    Single Leg Stand  5.37 seconds        Nutrition Therapy Plan and Nutrition  Goals:   Nutrition Assessments:   Nutrition Goals Re-Evaluation:   Nutrition Goals Discharge (Final Nutrition Goals Re-Evaluation):   Psychosocial: Target Goals: Acknowledge presence or absence of significant depression and/or stress, maximize coping skills, provide positive support system. Participant is able to verbalize types and ability to use techniques and skills needed for reducing stress and depression.  Initial Review & Psychosocial Screening: Initial Psych Review & Screening - 01/13/19 1110      Initial Review   Current issues with  None Identified      Family Dynamics   Good Support System?  Yes    Comments  No psychosocial barriers to participation in Cardiac Rehab identified. Patient has a strong support system of family, friends, and health care providers. He will continue to maintain a positive attitude and participate in health stress management if barriers to arise.      Barriers   Psychosocial barriers to participate in program  There are no identifiable barriers or psychosocial needs.      Screening Interventions    Interventions  Encouraged to exercise       Quality of Life Scores: Quality of Life - 01/13/19 1029      Quality of Life   Select  Quality of Life      Quality of Life Scores   Health/Function Pre  23.87 %    Socioeconomic Pre  26.63 %    Psych/Spiritual Pre  23.57 %    Family Pre  27.3 %    GLOBAL Pre  24.93 %      Scores of 19 and below usually indicate a poorer quality of life in these areas.  A difference of  2-3 points is a clinically meaningful difference.  A difference of 2-3 points in the total score of the Quality of Life Index has been associated with significant improvement in overall quality of life, self-image, physical symptoms, and general health in studies assessing change in quality of life.  PHQ-9: Recent Review Flowsheet Data    Depression screen [facility] 2/9 01/13/2019   Decreased Interest 0   Down, Depressed, Hopeless 0   PHQ - 2 Score 0     Interpretation of Total Score  Total Score Depression Severity:  1-4 = Minimal depression, 5-9 = Mild depression, 10-14 = Moderate depression, 15-19 = Moderately severe depression, 20-27 = Severe depression   Psychosocial Evaluation and Intervention:   Psychosocial Re-Evaluation: Psychosocial Re-Evaluation    Row Name 01/29/19 1351 02/23/19 1600           Psychosocial Re-Evaluation   Current issues with  None Identified  None Identified      Interventions  Encouraged to attend Cardiac Rehabilitation for the exercise  Encouraged to attend Cardiac Rehabilitation for the exercise      Continue Psychosocial Services   No Follow up required  No Follow up required         Psychosocial Discharge (Final Psychosocial Re-Evaluation): Psychosocial Re-Evaluation - 02/23/19 1600      Psychosocial Re-Evaluation   Current issues with  None Identified    Interventions  Encouraged to attend Cardiac Rehabilitation for the exercise    Continue Psychosocial Services   No Follow up required       Vocational  Rehabilitation: Provide vocational rehab assistance to qualifying candidates.   Vocational Rehab Evaluation & Intervention: Vocational Rehab - 01/13/19 1110      Initial Vocational Rehab Evaluation & Intervention   Assessment shows need for Vocational Rehabilitation  No  Education: Education Goals: Education classes will be provided on a weekly basis, covering required topics. Participant will state understanding/return demonstration of topics presented.  Learning Barriers/Preferences: Learning Barriers/Preferences - 01/13/19 1022      Learning Barriers/Preferences   Learning Barriers  Sight;Hearing    Learning Preferences  Individual Instruction;Skilled Demonstration       Education Topics: Hypertension, Hypertension Reduction -Define heart disease and high blood pressure. Discus how high blood pressure affects the body and ways to reduce high blood pressure.   Exercise and Your Heart -Discuss why it is important to exercise, the FITT principles of exercise, normal and abnormal responses to exercise, and how to exercise safely.   Angina -Discuss definition of angina, causes of angina, treatment of angina, and how to decrease risk of having angina.   Cardiac Medications -Review what the following cardiac medications are used for, how they affect the body, and side effects that may occur when taking the medications.  Medications include Aspirin, Beta blockers, calcium channel blockers, ACE Inhibitors, angiotensin receptor blockers, diuretics, digoxin, and antihyperlipidemics.   Congestive Heart Failure -Discuss the definition of CHF, how to live with CHF, the signs and symptoms of CHF, and how keep track of weight and sodium intake.   Heart Disease and Intimacy -Discus the effect sexual activity has on the heart, how changes occur during intimacy as we age, and safety during sexual activity.   Smoking Cessation / COPD -Discuss different methods to quit smoking,  the health benefits of quitting smoking, and the definition of COPD.   Nutrition I: Fats -Discuss the types of cholesterol, what cholesterol does to the heart, and how cholesterol levels can be controlled.   Nutrition II: Labels -Discuss the different components of food labels and how to read food label   Heart Parts/Heart Disease and PAD -Discuss the anatomy of the heart, the pathway of blood circulation through the heart, and these are affected by heart disease.   Stress I: Signs and Symptoms -Discuss the causes of stress, how stress may lead to anxiety and depression, and ways to limit stress.   Stress II: Relaxation -Discuss different types of relaxation techniques to limit stress.   Warning Signs of Stroke / TIA -Discuss definition of a stroke, what the signs and symptoms are of a stroke, and how to identify when someone is having stroke.   Knowledge Questionnaire Score: Knowledge Questionnaire Score - 01/13/19 1025      Knowledge Questionnaire Score   Pre Score  15/28       Core Components/Risk Factors/Patient Goals at Admission: Personal Goals and Risk Factors at Admission - 01/13/19 1020      Core Components/Risk Factors/Patient Goals on Admission    Weight Management  Yes;Obesity;Weight Maintenance;Weight Loss    Intervention  Weight Management: Develop a combined nutrition and exercise program designed to reach desired caloric intake, while maintaining appropriate intake of nutrient and fiber, sodium and fats, and appropriate energy expenditure required for the weight goal.;Weight Management: Provide education and appropriate resources to help participant work on and attain dietary goals.;Weight Management/Obesity: Establish reasonable short term and long term weight goals.;Obesity: Provide education and appropriate resources to help participant work on and attain dietary goals.    Admit Weight  222 lb 0.1 oz (100.7 kg)    Expected Outcomes  Short Term: Continue to  assess and modify interventions until short term weight is achieved;Long Term: Adherence to nutrition and physical activity/exercise program aimed toward attainment of established weight goal;Weight Maintenance: Understanding of  the daily nutrition guidelines, which includes 25-35% calories from fat, 7% or less cal from saturated fats, less than 252m cholesterol, less than 1.5gm of sodium, & 5 or more servings of fruits and vegetables daily;Weight Loss: Understanding of general recommendations for a balanced deficit meal plan, which promotes 1-2 lb weight loss per week and includes a negative energy balance of [id number] kcal/d;Understanding recommendations for meals to include 15-35% energy as protein, 25-35% energy from fat, 35-60% energy from carbohydrates, less than 2074mof dietary cholesterol, 20-35 gm of total fiber daily;Understanding of distribution of calorie intake throughout the day with the consumption of 4-5 meals/snacks    Hypertension  Yes    Intervention  Provide education on lifestyle modifcations including regular physical activity/exercise, weight management, moderate sodium restriction and increased consumption of fresh fruit, vegetables, and low fat dairy, alcohol moderation, and smoking cessation.;Monitor prescription use compliance.    Expected Outcomes  Short Term: Continued assessment and intervention until BP is < 140/9051mG in hypertensive participants. < 130/79m20m in hypertensive participants with diabetes, heart failure or chronic kidney disease.;Long Term: Maintenance of blood pressure at goal levels.    Lipids  Yes    Intervention  Provide education and support for participant on nutrition & aerobic/resistive exercise along with prescribed medications to achieve LDL <70mg61mL >40mg.56mExpected Outcomes  Short Term: Participant states understanding of desired cholesterol values and is compliant with medications prescribed. Participant is following exercise prescription and  nutrition guidelines.;Long Term: Cholesterol controlled with medications as prescribed, with individualized exercise RX and with personalized nutrition plan. Value goals: LDL < 70mg, 76m> 40 mg.       Core Components/Risk Factors/Patient Goals Review:  Goals and Risk Factor Review    Row Name 01/20/19 1611 01/29/19 1351 01/29/19 1353 02/23/19 1600       Core Components/Risk Factors/Patient Goals Review   Personal Goals Review  Weight Management/Obesity;Lipids;Hypertension;Tobacco Cessation  Weight Management/Obesity;Lipids;Hypertension;Tobacco Cessation  -  Weight Management/Obesity;Lipids;Hypertension;Tobacco Cessation    Review  Patient started exercise on 01/19/19 will continue to montior BP as moderate resting blood pressure elevations noted  Marcus Peterson to a good start to exercise at cardiac rehab. Marcus's vital signs have been stable.  Marcus Peterson to a good start to exercise at cardiac rehab. Marcus's has had some moderate resting and exertional blood pressure elevations. Dr Cooper Burt Knacken notified via fax  Marcus Jodell Ciprong well with exercise. Marcus's exertional blood pressure's have been improved since he BP medications have been adjusted.    Expected Outcomes  Patient will continue to partcipate in cardiac rehab for exercise, nutrition and lifestyle modifications  Patient will continue to partcipate in cardiac rehab for exercise, nutrition and lifestyle modifications  Patient will continue to partcipate in cardiac rehab for exercise, nutrition and lifestyle modifications  Marcus Jodell Ciproontinue to partcipate in cardiac rehab for exercise, nutrition and lifestyle modifications       Core Components/Risk Factors/Patient Goals at Discharge (Final Review):  Goals and Risk Factor Review - 02/23/19 1600      Core Components/Risk Factors/Patient Goals Review   Personal Goals Review  Weight Management/Obesity;Lipids;Hypertension;Tobacco Cessation    Review  Marcus Jodell Ciprong well with  exercise. Marcus's exertional blood pressure's have been improved since he BP medications have been adjusted.    Expected Outcomes  Marcus Jodell Ciproontinue to partcipate in cardiac rehab for exercise, nutrition and lifestyle modifications       ITP Comments: ITP Comments  [place] Name 01/13/19 1109 01/20/19 1613 01/29/19 1350 02/23/19 1558     ITP Comments  Dr. Fransico Him, Cardiac Rehab Medical Director  30 Day ITP Review. Patien first day of exercise was on 01/19/19  30 Day ITP Review. Jodell Peterson is off to a good start to exercise.  30 Day ITP Review. Jodell Peterson is with good attendance and participation in phase 2 cardiac rehab.       Comments: See ITP comments.Barnet Pall, RN,BSN 02/26/2019 12:59 PM

## 2019-02-25 ENCOUNTER — Encounter (HOSPITAL_COMMUNITY)
Admission: RE | Admit: 2019-02-25 | Discharge: 2019-02-25 | Disposition: A | Discharge status: Home / Self Care | Payer: Medicare Other | Source: Ambulatory Visit | Attending: Cardiovascular Disease | Admitting: Cardiovascular Disease

## 2019-02-25 ENCOUNTER — Other Ambulatory Visit: Payer: Self-pay

## 2019-02-25 ENCOUNTER — Encounter (HOSPITAL_COMMUNITY): Payer: Medicare Other

## 2019-02-25 DIAGNOSIS — I2121 ST elevation (STEMI) myocardial infarction involving left circumflex coronary artery: Secondary | ICD-10-CM | POA: Diagnosis not present

## 2019-02-25 DIAGNOSIS — Z955 Presence of coronary angioplasty implant and graft: Secondary | ICD-10-CM

## 2019-02-27 ENCOUNTER — Encounter (HOSPITAL_COMMUNITY)
Admission: RE | Admit: 2019-02-27 | Discharge: 2019-02-27 | Disposition: A | Discharge status: Home / Self Care | Payer: Medicare Other | Source: Ambulatory Visit | Attending: Cardiovascular Disease | Admitting: Cardiovascular Disease

## 2019-02-27 ENCOUNTER — Encounter (HOSPITAL_COMMUNITY): Payer: Medicare Other

## 2019-02-27 ENCOUNTER — Other Ambulatory Visit: Payer: Self-pay

## 2019-02-27 DIAGNOSIS — Z955 Presence of coronary angioplasty implant and graft: Secondary | ICD-10-CM

## 2019-02-27 DIAGNOSIS — I2121 ST elevation (STEMI) myocardial infarction involving left circumflex coronary artery: Secondary | ICD-10-CM | POA: Diagnosis not present

## 2019-03-02 ENCOUNTER — Encounter (HOSPITAL_COMMUNITY)
Admission: RE | Admit: 2019-03-02 | Discharge: 2019-03-02 | Disposition: A | Discharge status: Home / Self Care | Payer: Medicare Other | Source: Ambulatory Visit | Attending: Cardiovascular Disease | Admitting: Cardiovascular Disease

## 2019-03-02 ENCOUNTER — Other Ambulatory Visit: Payer: Self-pay

## 2019-03-02 DIAGNOSIS — Z955 Presence of coronary angioplasty implant and graft: Secondary | ICD-10-CM

## 2019-03-02 DIAGNOSIS — I2121 ST elevation (STEMI) myocardial infarction involving left circumflex coronary artery: Secondary | ICD-10-CM

## 2019-03-04 ENCOUNTER — Encounter (HOSPITAL_COMMUNITY)
Admission: RE | Admit: 2019-03-04 | Discharge: 2019-03-04 | Disposition: A | Discharge status: Home / Self Care | Payer: Medicare Other | Source: Ambulatory Visit | Attending: Cardiovascular Disease | Admitting: Cardiovascular Disease

## 2019-03-04 ENCOUNTER — Other Ambulatory Visit: Payer: Self-pay

## 2019-03-04 DIAGNOSIS — I2121 ST elevation (STEMI) myocardial infarction involving left circumflex coronary artery: Secondary | ICD-10-CM

## 2019-03-04 DIAGNOSIS — Z955 Presence of coronary angioplasty implant and graft: Secondary | ICD-10-CM

## 2019-03-06 ENCOUNTER — Encounter (HOSPITAL_COMMUNITY)
Admission: RE | Admit: 2019-03-06 | Discharge: 2019-03-06 | Disposition: A | Discharge status: Home / Self Care | Payer: Medicare Other | Source: Ambulatory Visit | Attending: Cardiovascular Disease | Admitting: Cardiovascular Disease

## 2019-03-06 ENCOUNTER — Other Ambulatory Visit: Payer: Self-pay

## 2019-03-06 DIAGNOSIS — I2121 ST elevation (STEMI) myocardial infarction involving left circumflex coronary artery: Secondary | ICD-10-CM

## 2019-03-06 DIAGNOSIS — Z955 Presence of coronary angioplasty implant and graft: Secondary | ICD-10-CM

## 2019-03-09 ENCOUNTER — Other Ambulatory Visit: Payer: Self-pay

## 2019-03-09 ENCOUNTER — Encounter (HOSPITAL_COMMUNITY)
Admission: RE | Admit: 2019-03-09 | Discharge: 2019-03-09 | Disposition: A | Discharge status: Home / Self Care | Payer: Medicare Other | Source: Ambulatory Visit | Attending: Cardiovascular Disease | Admitting: Cardiovascular Disease

## 2019-03-09 VITALS — Ht 69.5 in | Wt 218.0 lb

## 2019-03-09 DIAGNOSIS — I2121 ST elevation (STEMI) myocardial infarction involving left circumflex coronary artery: Secondary | ICD-10-CM

## 2019-03-09 DIAGNOSIS — Z955 Presence of coronary angioplasty implant and graft: Secondary | ICD-10-CM

## 2019-03-11 ENCOUNTER — Encounter (HOSPITAL_COMMUNITY)
Admission: RE | Admit: 2019-03-11 | Discharge: 2019-03-11 | Disposition: A | Discharge status: Home / Self Care | Payer: Medicare Other | Source: Ambulatory Visit | Attending: Cardiovascular Disease | Admitting: Cardiovascular Disease

## 2019-03-11 ENCOUNTER — Other Ambulatory Visit: Payer: Self-pay

## 2019-03-11 DIAGNOSIS — I2121 ST elevation (STEMI) myocardial infarction involving left circumflex coronary artery: Secondary | ICD-10-CM | POA: Diagnosis not present

## 2019-03-11 DIAGNOSIS — Z955 Presence of coronary angioplasty implant and graft: Secondary | ICD-10-CM

## 2019-03-11 NOTE — Progress Notes (Signed)
Discharge Progress Report  Patient Details  Name: Marcus Peterson MRN: [id number] Date of Birth: 1950/11/19 Referring Provider:     [facility] from 01/13/2019 in [place]  Referring Provider  Dr. Burt Knack       Number of Visits: 22  Reason for Discharge:  Patient reached a stable level of exercise. Patient independent in their exercise. Patient has met program and personal goals.  Smoking History:  Social History   Tobacco Use  Smoking Status Never Smoker  Smokeless Tobacco Former Systems developer  . Types: Chew    Diagnosis:  Status post coronary artery stent placement  ST elevation myocardial infarction involving left circumflex coronary artery (HCC)  ADL [facility]:   Initial Exercise Prescription: Initial Exercise Prescription - 01/13/19 1000      Date of Initial Exercise RX and Referring Provider   Date  01/13/19    Referring Provider  Dr. Burt Knack    Expected Discharge Date  03/13/19      Recumbant Bike   Level  2    Watts  35    Minutes  15    METs  3.1      NuStep   Level  3    SPM  85    Minutes  15    METs  2.9      Prescription Details   Frequency (times per week)  2    Duration  Progress to 30 minutes of continuous aerobic without signs/symptoms of physical distress      Intensity   THRR 40-80% of Max Heartrate  67-122    Ratings of Perceived Exertion  11-13      Progression   Progression  Continue to progress workloads to maintain intensity without signs/symptoms of physical distress.      Resistance Training   Training Prescription  Yes    Weight  4 lbs.     Reps  10-15       Discharge Exercise Prescription (Final Exercise Prescription Changes): Exercise Prescription Changes - 03/11/19 1000      Response to Exercise   Blood Pressure (Admit)  150/80    Blood Pressure (Exercise)  164/68    Blood Pressure (Exit)  130/78    Heart Rate (Admit)  78 bpm    Heart Rate (Exercise)  114 bpm     Heart Rate (Exit)  81 bpm    Rating of Perceived Exertion (Exercise)  13    Symptoms  None    Comments  Pt last day of exercise.     Duration  Progress to 30 minutes of  aerobic without signs/symptoms of physical distress    Intensity  THRR unchanged      Progression   Progression  Continue to progress workloads to maintain intensity without signs/symptoms of physical distress.    Average METs  4.2      Resistance Training   Training Prescription  No      Interval Training   Interval Training  No      Recumbant Bike   Level  3    Minutes  15    METs  3.1      NuStep   Level  4    SPM  85    Minutes  15    METs  5.3      Home Exercise Plan   Plans to continue exercise at  Home (comment)    Frequency  Add 3 additional days to  program exercise sessions.    Initial Home Exercises Provided  01/28/19       Functional Capacity: 6 Minute Walk    Row Name 01/13/19 1013 03/09/19 1505       6 Minute Walk   Phase  Initial  Discharge    Distance  1565 feet  1854 feet    Distance % Change  -  18.47 %    Distance Feet Change  -  289 ft    Walk Time  6 minutes  6 minutes    # of Rest Breaks  0  0    MPH  2.9  3.5    METS  3.5  4.2    RPE  12  13    Perceived Dyspnea   0  0    VO2 Peak  12.59  14.87    Symptoms  No  No    Resting HR  68 bpm  64 bpm    Resting BP  162/80  136/72    Resting Oxygen Saturation   97 %  -    Exercise Oxygen Saturation  during 6 min walk  97 %  -    Max Ex. HR  107 bpm  119 bpm    Max Ex. BP  164/88  160/88    2 Minute Post BP  160/86  136/70       Psychological, QOL, Others - Outcomes: PHQ 2/9: Depression screen [facility] 2/9 03/11/2019 01/13/2019  Decreased Interest 0 0  Down, Depressed, Hopeless 0 0  PHQ - 2 Score 0 0    Quality of Life: Quality of Life - 03/11/19 1034      Quality of Life   Select  Quality of Life      Quality of Life Scores   Health/Function Post  24.87 %    Socioeconomic Post  27.5 %    Psych/Spiritual Post  26  %    Family Post  25.1 %    GLOBAL Post  25.68 %       Personal Goals: Goals established at orientation with interventions provided to work toward goal. Personal Goals and Risk Factors at Admission - 01/13/19 1020      Core Components/Risk Factors/Patient Goals on Admission    Weight Management  Yes;Obesity;Weight Maintenance;Weight Loss    Intervention  Weight Management: Develop a combined nutrition and exercise program designed to reach desired caloric intake, while maintaining appropriate intake of nutrient and fiber, sodium and fats, and appropriate energy expenditure required for the weight goal.;Weight Management: Provide education and appropriate resources to help participant work on and attain dietary goals.;Weight Management/Obesity: Establish reasonable short term and long term weight goals.;Obesity: Provide education and appropriate resources to help participant work on and attain dietary goals.    Admit Weight  222 lb 0.1 oz (100.7 kg)    Expected Outcomes  Short Term: Continue to assess and modify interventions until short term weight is achieved;Long Term: Adherence to nutrition and physical activity/exercise program aimed toward attainment of established weight goal;Weight Maintenance: Understanding of the daily nutrition guidelines, which includes 25-35% calories from fat, 7% or less cal from saturated fats, less than '200mg'$  cholesterol, less than 1.5gm of sodium, & 5 or more servings of fruits and vegetables daily;Weight Loss: Understanding of general recommendations for a balanced deficit meal plan, which promotes 1-2 lb weight loss per week and includes a negative energy balance of [phone] kcal/d;Understanding recommendations for meals to include 15-35% energy as protein, 25-35% energy  from fat, 35-60% energy from carbohydrates, less than '200mg'$  of dietary cholesterol, 20-35 gm of total fiber daily;Understanding of distribution of calorie intake throughout the day with the  consumption of 4-5 meals/snacks    Hypertension  Yes    Intervention  Provide education on lifestyle modifcations including regular physical activity/exercise, weight management, moderate sodium restriction and increased consumption of fresh fruit, vegetables, and low fat dairy, alcohol moderation, and smoking cessation.;Monitor prescription use compliance.    Expected Outcomes  Short Term: Continued assessment and intervention until BP is < 140/71m HG in hypertensive participants. < 130/851mHG in hypertensive participants with diabetes, heart failure or chronic kidney disease.;Long Term: Maintenance of blood pressure at goal levels.    Lipids  Yes    Intervention  Provide education and support for participant on nutrition & aerobic/resistive exercise along with prescribed medications to achieve LDL '70mg'$ , HDL >'40mg'$ .    Expected Outcomes  Short Term: Participant states understanding of desired cholesterol values and is compliant with medications prescribed. Participant is following exercise prescription and nutrition guidelines.;Long Term: Cholesterol controlled with medications as prescribed, with individualized exercise RX and with personalized nutrition plan. Value goals: LDL < '70mg'$ , HDL > 40 mg.        Personal Goals Discharge: Goals and Risk Factor Review    Row Name 01/20/19 1611 01/29/19 1351 01/29/19 1353 02/23/19 1600 03/11/19 1621     Core Components/Risk Factors/Patient Goals Review   Personal Goals Review  Weight Management/Obesity;Lipids;Hypertension;Tobacco Cessation  Weight Management/Obesity;Lipids;Hypertension;Tobacco Cessation  -  Weight Management/Obesity;Lipids;Hypertension;Tobacco Cessation  Weight Management/Obesity;Lipids;Hypertension;Tobacco Cessation   Review  Patient started exercise on 01/19/19 will continue to montior BP as moderate resting blood pressure elevations noted  DuJodell Cipros off to a good start to exercise at cardiac rehab. Dudley's vital signs have been stable.   DuJodell Cipros off to a good start to exercise at cardiac rehab. Dudley's has had some moderate resting and exertional blood pressure elevations. Dr CoBurt Knackas been notified via fax  DuJodell Cipros doing well with exercise. Dudley's exertional blood pressure's have been improved since he BP medications have been adjusted.  DuJodell [facility]as [facility].  He did well throughout the program with exercise.  He felt that he increased his strength and flexibility.   Expected Outcomes  Patient will continue to partcipate in cardiac rehab for exercise, nutrition and lifestyle modifications  Patient will continue to partcipate in cardiac rehab for exercise, nutrition and lifestyle modifications  Patient will continue to partcipate in cardiac rehab for exercise, nutrition and lifestyle modifications  DuJodell Ciproill continue to partcipate in cardiac rehab for exercise, nutrition and lifestyle modifications  DuJodell Ciproill continue to partcipate in exercise, nutrition and lifestyle modifications opportunities.  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      Exercise Goals and Review: Exercise Goals    Row Name 01/13/19 1018             Exercise Goals   Increase Physical Activity  Yes       Intervention  Provide advice, education, support and counseling about physical activity/exercise needs.;Develop an individualized exercise prescription for aerobic and resistive training based on initial evaluation findings, risk stratification, comorbidities and participant's personal goals.       Expected Outcomes  Short Term: Attend rehab on a regular basis to increase amount of physical activity.;Long Term: Add in home exercise to make exercise part  of routine and to increase amount of physical activity.;Long Term: Exercising regularly at least 3-5 days a week.       Increase Strength and Stamina  Yes       Intervention  Provide advice, education, support  and counseling about physical activity/exercise needs.;Develop an individualized exercise prescription for aerobic and resistive training based on initial evaluation findings, risk stratification, comorbidities and participant's personal goals.       Expected Outcomes  Short Term: Increase workloads from initial exercise prescription for resistance, speed, and METs.;Short Term: Perform resistance training exercises routinely during rehab and add in resistance training at home;Long Term: Improve cardiorespiratory fitness, muscular endurance and strength as measured by increased METs and functional capacity (6MWT)       Able to understand and use rate of perceived exertion (RPE) scale  Yes       Intervention  Provide education and explanation on how to use RPE scale       Expected Outcomes  Short Term: Able to use RPE daily in rehab to express subjective intensity level;Long Term:  Able to use RPE to guide intensity level when exercising independently       Knowledge and understanding of Target Heart Rate Range (THRR)  Yes       Intervention  Provide education and explanation of THRR including how the numbers were predicted and where they are located for reference       Expected Outcomes  Short Term: Able to state/look up THRR;Long Term: Able to use THRR to govern intensity when exercising independently;Short Term: Able to use daily as guideline for intensity in rehab       Able to check pulse independently  Yes       Intervention  Provide education and demonstration on how to check pulse in carotid and radial arteries.;Review the importance of being able to check your own pulse for safety during independent exercise       Expected Outcomes  Short Term: Able to explain why pulse checking is important during independent exercise;Long Term: Able to check pulse independently and accurately       Understanding of Exercise Prescription  Yes       Intervention  Provide education, explanation, and written  materials on patient's individual exercise prescription       Expected Outcomes  Short Term: Able to explain program exercise prescription;Long Term: Able to explain home exercise prescription to exercise independently          Exercise Goals Re-Evaluation: Exercise Goals Re-Evaluation    Row Name 01/19/19 0951 01/28/19 1026 02/25/19 0900 03/11/19 1024       Exercise Goal Re-Evaluation   Exercise Goals Review  Increase Physical Activity;Able to understand and use rate of perceived exertion (RPE) scale  Increase Physical Activity;Increase Strength and Stamina;Able to understand and use rate of perceived exertion (RPE) scale;Knowledge and understanding of Target Heart Rate Range (THRR);Able to check pulse independently;Understanding of Exercise Prescription  Increase Physical Activity;Increase Strength and Stamina;Able to understand and use rate of perceived exertion (RPE) scale;Knowledge and understanding of Target Heart Rate Range (THRR);Able to check pulse independently;Understanding of Exercise Prescription  Increase Physical Activity;Increase Strength and Stamina;Able to understand and use rate of perceived exertion (RPE) scale;Knowledge and understanding of Target Heart Rate Range (THRR);Able to check pulse independently;Understanding of Exercise Prescription    Comments  Patient able to understand and use RPE scale appropriately.  Reviewed HEP, METs, and goals with Pt. Reviewed METs and goals with Pt. Pt is  progressing well and has a MET level of 2.5. Pt understands goals, exercise Rx, THRR, RPE scale, pulse coutning, weather precautions, warm up and cool down stretches, and end points of exercise. Pt is not currently exercising at home. Encouraged Pt to start wlaking 2-4 days for 30-45 minutes per day in addition to CR program.  Reviewed METs and goals with Pt. Pt is progressing well and has a MET level of 3.3. Pt continues to increase workloads. Pt is walking at home for 30 minutes 2-4 days per  week in addition to CR program.  Pt graduated CR program today. Pt porgressed well through the program and ended with a MET level of 4.2. Pt increased workloads and MET level gradually. Pt states he will continue walking at home 5-7 days a week for 30-45 minutes. Pt was encouraged to get at least 150 minutes of aerobic activity each week.    Expected Outcomes  Increase workloads as tolerated to help improve cardiorespiratory fitness.  Will continue to monitor and progress Pt as tolerated.  Will continue to monitor and progress Pt as tolerated.  Pt will continue walking at home for exercise.       Nutrition & Weight - Outcomes: Pre Biometrics - 01/13/19 1018      Pre Biometrics   Height  5' 9.5" (1.765 m)    Weight  100.7 kg    Waist Circumference  44.5 inches    Hip Circumference  45 inches    Waist to Hip Ratio  0.99 %    BMI (Calculated)  32.33    Triceps Skinfold  23 mm    % Body Fat  32.9 %    Grip Strength  36 kg    Flexibility  0 in    Single Leg Stand  5.37 seconds      Post Biometrics - 03/09/19 1506       Post  Biometrics   Height  5' 9.5" (1.765 m)    Weight  98.9 kg    Waist Circumference  41 inches    Hip Circumference  44 inches    Waist to Hip Ratio  0.93 %    BMI (Calculated)  31.75    Triceps Skinfold  20 mm    % Body Fat  30.5 %    Grip Strength  35 kg    Flexibility  8 in    Single Leg Stand  10.43 seconds       Nutrition:   Nutrition Discharge:   Education Questionnaire Score: Knowledge Questionnaire Score - 03/11/19 1035      Knowledge Questionnaire Score   Post Score  21/24       Goals reviewed with patient; copy given to patient.

## 2019-03-13 ENCOUNTER — Encounter (HOSPITAL_COMMUNITY): Payer: Medicare Other

## 2019-03-19 ENCOUNTER — Telehealth: Payer: Self-pay | Admitting: Cardiovascular Disease

## 2019-03-19 NOTE — Telephone Encounter (Signed)
Per call from [facility]  // Dr Lucita Lora or [facility] would like a call back to confirm that this pt can have any dental work done please.  Cleanings, fillings things of that nature.  #[phone]

## 2019-03-19 NOTE — Telephone Encounter (Signed)
     This patient should be able to undergo routine dental cleaning and fillings with no issue. He has a hx of recent STEMI with PCI and is on ASA and Brilinta therapies. They should not stop these medications. If there is further question, they can send a formal cardiac clearance request. It appears that the patient has follow up on 04/06/19 with Richardson Dopp, PA-C.  Kathyrn Drown NP-C [place] Pager: [phone]

## 2019-04-05 NOTE — Progress Notes (Signed)
Cardiology Office Note:    Date:  04/06/2019   ID:  Marcus Peterson, DOB 29-Oct-1950, MRN [id number]  PCP:  Lajean Manes, MD  Cardiologist:  Sherren Mocha, MD  Electrophysiologist:  None   Referring MD: Lajean Manes, MD   Chief Complaint  Patient presents with  . Follow-up    CAD    History of Present Illness:    Marcus Peterson is a 68 y.o. male with:   Coronary artery disease   S/p Lat STEMI tx with DES to LCx/OM  Echocardiogram 11/2018: EF 60-65  Hypertension   Rheumatoid arthritis   Mixed hyperlipidemia    Mr. Southwell was last seen in clinic by Leanor Kail, PA-C for post hospital follow up after his myocardial infarction.  He had some issues with shortness of breath at that time that seemed to be due to Ticagrelor.  He was advised to consume caffeine to help with this.    He returns for follow-up.  He is here alone.  Since last seen, he has been doing well.  He completed cardiac rehabilitation.  He has not had exertional chest pain or shortness of breath.  He has not had syncope or near syncope, orthopnea or significant leg swelling.  He does note that his blood pressure has been above target.  His dose of benazepril was increased a couple of months ago without much improvement.  He used to be on amlodipine prior to his heart attack.    Prior CV studies:   The following studies were reviewed today:   Echocardiogram 12/04/2018 EF 60-65, Gr 1 DD, no RWMA  Cardiac catheterization 12/03/2018 LM irregs LAD prox 50, mid 50 LCx mid 70; OM2 95 RCA mild dz  EF 55-65 PCI: 2.5 x 24 Synergy DES to LCx/OM2      Past Medical History:  Diagnosis Date  . Arthritis   . CAD in native artery    a. cath 11/2018 - Severe left circumflex/obtuse marginal stenosis s/p DES to both. 50% LAD>> medical therapy   . Cancer ([place])    melanoma on right arm   . Central serous choroidopathy   . Coronary artery disease   . Erectile dysfunction    Situation reaction   . GERD (gastroesophageal reflux disease)   . Hyperlipidemia   . Hypertension   . MI (myocardial infarction) ([place]) 12/03/2018  . Nonspecific abnormal unspecified cardiovascular function study   . Rheumatic arteritis   . Right wrist fracture   . Shingles 2010   Surgical Hx: The patient  has a past surgical history that includes Spine surgery; Joint replacement; Hernia repair (01/2011); left heart catheterization with coronary angiogram (N/A, 11/05/2012); s/p arthroscopic surgery right elbow (Right); left knee replacement (Left); adenomotous colon polyp (N/A, 2001 and 2012); Colonoscopy with propofol (N/A, 11/22/2015); Coronary/Graft Acute MI Revascularization (N/A, 12/03/2018); LEFT HEART CATH AND CORONARY ANGIOGRAPHY (N/A, 12/03/2018); Cardiac catheterization; and Coronary angioplasty.   Current Medications: Current Meds  Medication Sig  . aspirin EC 81 MG EC tablet Take 1 tablet (81 mg total) by mouth daily.  Marland Kitchen atorvastatin (LIPITOR) 80 MG tablet Take 1 tablet (80 mg total) by mouth daily at 6 PM.  . benazepril (LOTENSIN) 40 MG tablet Take 1 tablet (40 mg total) by mouth daily.  . Calcium Carbonate-Vitamin D (CALCIUM + D PO) Take 1 tablet by mouth daily.   . folic acid (FOLVITE) 1 MG tablet Take 1 mg by mouth daily.  Marland Kitchen inFLIXimab-abda (RENFLEXIS IV) Inject into the vein. Takes injection  every 8 weeks for RA  . methotrexate 2.5 MG tablet Take 17.5 mg by mouth once a week. On Fridays  . metoprolol succinate (TOPROL-XL) 50 MG 24 hr tablet Take 50 mg by mouth daily. Take with or immediately following a meal.  . Multiple Vitamin (MULTIVITAMIN) tablet Take 1 tablet by mouth daily.    . nitroGLYCERIN (NITROSTAT) 0.4 MG SL tablet Place 1 tablet (0.4 mg total) under the tongue every 5 (five) minutes as needed for chest pain.  . pantoprazole (PROTONIX) 40 MG tablet Take 1 tablet (40 mg total) by mouth daily.  . sildenafil (REVATIO) 20 MG tablet Take 20 mg by mouth as needed (erectile dysfuction).  .  ticagrelor (BRILINTA) 90 MG TABS tablet Take 1 tablet (90 mg total) by mouth 2 (two) times daily.  . [DISCONTINUED] nitroGLYCERIN (NITROSTAT) 0.4 MG SL tablet Place 0.4 mg under the tongue every 5 (five) minutes as needed for chest pain.     Allergies:   Patient has no known allergies.   Social History   Tobacco Use  . Smoking status: Never Smoker  . Smokeless tobacco: Former Systems developer    Types: Chew  Substance Use Topics  . Alcohol use: No  . Drug use: No     Family Hx: The patient's family history includes Cancer in his mother; Heart disease in his father.  ROS:   Please see the history of present illness.    ROS All other systems reviewed and are negative.   EKGs/Labs/Other Test Reviewed:    EKG:  EKG is not ordered today.  The ekg ordered today demonstrates n/a  Recent Labs: 12/05/2018: BUN 14; Creatinine, Ser 0.89; Hemoglobin 13.2; Platelets 291; Potassium 4.1; Sodium 134 01/26/2019: ALT 25   Recent Lipid Panel Lab Results  Component Value Date/Time   CHOL 103 01/26/2019 09:42 AM   TRIG 158 (H) 01/26/2019 09:42 AM   HDL 30 (L) 01/26/2019 09:42 AM   CHOLHDL 3.4 01/26/2019 09:42 AM   CHOLHDL 5.7 12/04/2018 02:16 AM   LDLCALC 46 01/26/2019 09:42 AM    Physical Exam:    VS:  BP (!) 166/90   Pulse (!) 57   Ht 5\' 10"  (1.778 m)   Wt 218 lb (98.9 kg)   SpO2 94%   BMI 31.28 kg/m     Wt Readings from Last 3 Encounters:  04/06/19 218 lb (98.9 kg)  03/09/19 218 lb 0.6 oz (98.9 kg)  01/13/19 222 lb 0.1 oz (100.7 kg)     Physical Exam  Constitutional: He is oriented to person, place, and time. He appears well-developed and well-nourished. No distress.  HENT:  Head: Normocephalic and atraumatic.  Eyes: No scleral icterus.  Neck: No JVD present. No thyromegaly present.  Cardiovascular: Normal rate, regular rhythm and normal heart sounds.  No murmur heard. Pulmonary/Chest: Effort normal and breath sounds normal. He has no rales.  Abdominal: Soft. There is no  hepatomegaly.  Musculoskeletal:        General: No edema.  Lymphadenopathy:    He has no cervical adenopathy.  Neurological: He is alert and oriented to person, place, and time.  Skin: Skin is warm and dry.  Psychiatric: He has a normal mood and affect.    ASSESSMENT & PLAN:    1. Coronary artery disease involving native coronary artery of native heart without angina pectoris S/p Lat STEMI in 11/2018 tx with DES to LCx/OM2.  He had mod residual dz in the LAD that is tx medically.  EF is preserved.  He is doing well without anginal symptoms.  We discussed the importance of dual antiplatelet therapy for 1 year post MI.  Continue aspirin, ticagrelor, atorvastatin, metoprolol, benazepril.  2. Essential hypertension Blood pressure above target.  Continue current dose of benazepril, metoprolol succinate.  Start amlodipine 5 mg daily.  Follow-up with me in 3 months virtual or in person to recheck blood pressure.  Obtain follow-up BMP today.  3. Hyperlipidemia LDL goal <70 LDL in 01/2019 was 46.  Continue current therapy.   Dispo:  Return in about 3 months (around 07/05/2019) for Routine Follow Up, w/ Richardson Dopp, PA-C, (virtual or in-person).   Medication Adjustments/Labs and Tests Ordered: Current medicines are reviewed at length with the patient today.  Concerns regarding medicines are outlined above.  Tests Ordered: Orders Placed This Encounter  Procedures  . Basic metabolic panel   Medication Changes: Meds ordered this encounter  Medications  . nitroGLYCERIN (NITROSTAT) 0.4 MG SL tablet    Sig: Place 1 tablet (0.4 mg total) under the tongue every 5 (five) minutes as needed for chest pain.    Dispense:  25 tablet    Refill:  3  . amLODipine (NORVASC) 5 MG tablet    Sig: Take 1 tablet (5 mg total) by mouth daily.    Dispense:  90 tablet    Refill:  3    Signed, Richardson Dopp, PA-C  04/06/2019 10:35 AM    [facility] [place], [place], [place]   [address] Phone: [phone]; Fax: [phone]

## 2019-04-06 ENCOUNTER — Other Ambulatory Visit: Payer: Self-pay

## 2019-04-06 ENCOUNTER — Ambulatory Visit: Payer: Medicare Other | Admitting: Physician Assistant

## 2019-04-06 ENCOUNTER — Encounter: Payer: Self-pay | Admitting: Physician Assistant

## 2019-04-06 VITALS — BP 166/90 | HR 57 | Ht 70.0 in | Wt 218.0 lb

## 2019-04-06 DIAGNOSIS — E785 Hyperlipidemia, unspecified: Secondary | ICD-10-CM

## 2019-04-06 DIAGNOSIS — I2510 Atherosclerotic heart disease of native coronary artery without angina pectoris: Secondary | ICD-10-CM

## 2019-04-06 DIAGNOSIS — I10 Essential (primary) hypertension: Secondary | ICD-10-CM

## 2019-04-06 MED ORDER — AMLODIPINE BESYLATE 5 MG PO TABS: 5.0000 mg | ORAL_TABLET | Freq: Every day | ORAL | 3 refills | Status: DC

## 2019-04-06 MED ORDER — NITROGLYCERIN 0.4 MG SL SUBL: 0.4000 mg | SUBLINGUAL_TABLET | SUBLINGUAL | 3 refills | Status: AC | PRN

## 2019-04-06 NOTE — Patient Instructions (Signed)
Medication Instructions:  Your physician has recommended you make the following change in your medication:  1.  START Amlodipine 5 mg daily  *If you need a refill on your cardiac medications before your next appointment, please call your pharmacy*  Lab Work: TODAY:  BMET  If you have labs (blood work) drawn today and your tests are completely normal, you will receive your results only by: Marland Kitchen MyChart Message (if you have MyChart) OR . A paper copy in the mail If you have any lab test that is abnormal or we need to change your treatment, we will call you to review the results.  Testing/Procedures: None ordered  Follow-Up: At [facility], you and your health needs are our priority.  As part of our continuing mission to provide you with exceptional heart care, we have created designated Provider Care Teams.  These Care Teams include your primary Cardiologist (physician) and Advanced Practice Providers (APPs -  Physician Assistants and Nurse Practitioners) who all work together to provide you with the care you need, when you need it.  Your next appointment:   07/07/2019 ARRIVE AT 8:30 FOR REGISTRATION The format for your next appointment:   In Person  Provider:   Richardson Dopp, PA-C  Other Instructions

## 2019-04-07 LAB — BASIC METABOLIC PANEL
BUN/Creatinine Ratio: 15 (ref 10–24)
BUN: 13 mg/dL (ref 8–27)
CO2: 18 mmol/L — ABNORMAL LOW (ref 20–29)
Calcium: 10.1 mg/dL (ref 8.6–10.2)
Chloride: 105 mmol/L (ref 96–106)
Creatinine, Ser: 0.89 mg/dL (ref 0.76–1.27)
GFR calc Af Amer: 102 mL/min/{1.73_m2} (ref >59)
GFR calc non Af Amer: 88 mL/min/{1.73_m2} (ref >59)
Glucose: 84 mg/dL (ref 65–99)
Potassium: 4.3 mmol/L (ref 3.5–5.2)
Sodium: 139 mmol/L (ref 134–144)

## 2019-04-14 ENCOUNTER — Other Ambulatory Visit: Payer: Self-pay

## 2019-04-14 MED ORDER — PANTOPRAZOLE SODIUM 40 MG PO TBEC: 40.0000 mg | DELAYED_RELEASE_TABLET | Freq: Every day | ORAL | 3 refills | Status: DC

## 2019-05-25 ENCOUNTER — Telehealth: Payer: Self-pay | Admitting: Cardiovascular Disease

## 2019-05-25 NOTE — Telephone Encounter (Signed)
   [facility] Pre-operative Risk Assessment    Request for surgical clearance:  1. What type of surgery is being performed? Tooth extraction  2. When is this surgery scheduled? 07/30/19  3. What type of clearance is required (medical clearance vs. Pharmacy clearance to hold med vs. Both)? both  4. Are there any medications that need to be held prior to surgery and how long? ticagrelor (BRILINTA) 90 MG TABS tablet - whatever Dr. Burt Knack advises for him to do.   5. Practice name and name of physician performing surgery? Dr. Hillery Jacks [facility]  6. What is your office phone number [phone]   7.   What is your office fax number [phone]  8.   Anesthesia type (None, local, MAC, general) ? no    _________________________________________________________________   (provider comments below)

## 2019-05-25 NOTE — Telephone Encounter (Signed)
Pre-op covering staff, can we find out how many teeth need to be extracted? Is it just one?  Thank you!

## 2019-05-26 NOTE — Telephone Encounter (Signed)
   Primary Cardiologist: Sherren Mocha, MD  Chart reviewed as part of pre-operative protocol coverage. Patient had a STEMI in 11/2018 and had 2 stents placed. Uninterrupted dual antiplatelet therapy with Aspirin and Brilinta was recommended for a minimum of 12 months. I called dentist office today and confirmed that it is only 1 tooth that needs to be extracted and this will be done under local anesthesia. Simple dental extractions are considered low risk procedures per guidelines and generally do not require any specific cardiac clearance. It is also generally accepted that for simple extractions and dental cleanings, there is no need to interrupt blood thinner therapy. Given patient's recent STEMI, would not recommend stopping Brilinta for 1 extraction.   SBE prophylaxis is not required for the patient.  I will route this recommendation to the requesting party via Epic fax function and remove from pre-op pool.  Please call with questions.  Darreld Mclean, PA-C 05/26/2019, 8:25 AM

## 2019-07-06 NOTE — Progress Notes (Signed)
Cardiology Office Note:    Date:  07/07/2019   ID:  Marcus Peterson, DOB 1951/04/14, MRN [id number]  PCP:  Marcus Manes, MD  Cardiologist:  Marcus Mocha, MD  Electrophysiologist:  None   Referring MD: Marcus Manes, MD   Chief Complaint:  Follow-up (Hypertension)    Patient Profile:    Marcus Peterson is a 69 y.o. male with:    Coronary artery disease  ? S/p Lat STEMI 8.2020 tx with DES to LCx/OM ? Echocardiogram 11/2018: EF 60-65  Hypertension   Rheumatoid arthritis   Mixed hyperlipidemia   Prior CV studies:  Echocardiogram 12/04/2018 EF 60-65, Gr 1 DD, no RWMA  Cardiac catheterization 12/03/2018 LM irregs LAD prox 50, mid 50 LCx mid 70; OM2 95 RCA mild dz  EF 55-65 PCI: 2.5 x 24 Synergy DES to LCx/OM2    History of Present Illness:    Marcus Peterson was last seen in clinic in 03/2019.  His BP was uncontrolled at that time and I added Amlodipine to his medical regimen.    He returns for follow-up.  He is here alone.  Since last seen, he has been doing well.  He has not had chest discomfort, significant shortness of breath, syncope.  He does get lightheaded when he stands up too quickly.  This is a chronic problem without change.  His blood pressures at home have been averaging 120s-130s/70s.    Past Medical History:  Diagnosis Date  . Arthritis   . CAD in native artery    a. cath 11/2018 - Severe left circumflex/obtuse marginal stenosis s/p DES to both. 50% LAD>> medical therapy   . Cancer ([place])    melanoma on right arm   . Central serous choroidopathy   . Coronary artery disease   . Erectile dysfunction    Situation reaction  . GERD (gastroesophageal reflux disease)   . Hyperlipidemia   . Hypertension   . MI (myocardial infarction) ([place]) 12/03/2018  . Nonspecific abnormal unspecified cardiovascular function study   . Rheumatic arteritis   . Right wrist fracture   . Shingles 2010    Current Medications: Current Meds  Medication Sig  .  aspirin EC 81 MG EC tablet Take 1 tablet (81 mg total) by mouth daily.  Marland Kitchen atorvastatin (LIPITOR) 80 MG tablet Take 1 tablet (80 mg total) by mouth daily at 6 PM.  . benazepril (LOTENSIN) 40 MG tablet Take 1 tablet (40 mg total) by mouth daily.  . Calcium Carbonate-Vitamin D (CALCIUM + D PO) Take 1 tablet by mouth daily.   . folic acid (FOLVITE) 1 MG tablet Take 1 mg by mouth daily.  Marland Kitchen inFLIXimab-abda (RENFLEXIS IV) Inject into the vein. Takes injection every 8 weeks for RA  . methotrexate 2.5 MG tablet Take 17.5 mg by mouth once a week. On Fridays  . metoprolol succinate (TOPROL-XL) 50 MG 24 hr tablet Take 50 mg by mouth daily. Take with or immediately following a meal.  . Multiple Vitamin (MULTIVITAMIN) tablet Take 1 tablet by mouth daily.    . nitroGLYCERIN (NITROSTAT) 0.4 MG SL tablet Place 1 tablet (0.4 mg total) under the tongue every 5 (five) minutes as needed for chest pain.  . sildenafil (REVATIO) 20 MG tablet Take 20 mg by mouth as needed (erectile dysfuction).  . ticagrelor (BRILINTA) 90 MG TABS tablet Take 1 tablet (90 mg total) by mouth 2 (two) times daily.  . [DISCONTINUED] atorvastatin (LIPITOR) 80 MG tablet Take 1 tablet (80 mg total) by mouth  daily at 6 PM.  . [DISCONTINUED] pantoprazole (PROTONIX) 40 MG tablet Take 1 tablet (40 mg total) by mouth daily.     Allergies:   Patient has no known allergies.   Social History   Tobacco Use  . Smoking status: Never Smoker  . Smokeless tobacco: Former Systems developer    Types: Chew  Substance Use Topics  . Alcohol use: No  . Drug use: No     Family Hx: The patient's family history includes Cancer in his mother; Heart disease in his father.  ROS   EKGs/Labs/Other Test Reviewed:    EKG:  EKG is  ordered today.  The ekg ordered today demonstrates sinus bradycardia, HR 58, normal axis, first-degree AV block (PR 262), QTc 428, similar to prior tracings  Recent Labs: 12/05/2018: Hemoglobin 13.2; Platelets 291 01/26/2019: ALT  25 04/06/2019: BUN 13; Creatinine, Ser 0.89; Potassium 4.3; Sodium 139   Recent Lipid Panel Lab Results  Component Value Date/Time   CHOL 103 01/26/2019 09:42 AM   TRIG 158 (H) 01/26/2019 09:42 AM   HDL 30 (L) 01/26/2019 09:42 AM   CHOLHDL 3.4 01/26/2019 09:42 AM   CHOLHDL 5.7 12/04/2018 02:16 AM   LDLCALC 46 01/26/2019 09:42 AM    Physical Exam:    VS:  BP 130/60   Pulse (!) 58   Ht 5\' 10"  (1.778 m)   Wt 225 lb 12.8 oz (102.4 kg)   SpO2 96%   BMI 32.40 kg/m     Wt Readings from Last 3 Encounters:  07/07/19 225 lb 12.8 oz (102.4 kg)  04/06/19 218 lb (98.9 kg)  03/09/19 218 lb 0.6 oz (98.9 kg)     Constitutional:      Appearance: Healthy appearance. Not in distress.  Neck:     Thyroid: Thyroid normal.     Vascular: JVD normal.  Pulmonary:     Effort: Pulmonary effort is normal.     Breath sounds: No wheezing. No rales.  Cardiovascular:     Normal rate. Regular rhythm. Normal S1. Normal S2.     Murmurs: There is no murmur.  Edema:    Peripheral edema absent.  Abdominal:     Palpations: Abdomen is soft. There is no hepatomegaly.  Skin:    General: Skin is warm and dry.  Neurological:     General: No focal deficit present.     Mental Status: Alert and oriented to person, place and time.     Cranial Nerves: Cranial nerves are intact.      ASSESSMENT & PLAN:    1. Essential hypertension Blood pressure is much better controlled.  He has documented readings at home that signify his blood pressure is optimal.  Continue current therapy which includes amlodipine 5 mg daily, benazepril 40 mg daily, metoprolol succinate 50 mg daily.  He will follow up with Dr. Burt Peterson in 6 months.  This will be 1 year out from his myocardial infarction.  We can consider stopping his ticagrelor at that visit.   Dispo:  Return in about 6 months (around 01/07/2020) for Routine Follow Up, w/ Dr. Burt Peterson, in person.   Medication Adjustments/Labs and Tests Ordered: Current medicines are  reviewed at length with the patient today.  Concerns regarding medicines are outlined above.  Tests Ordered: Orders Placed This Encounter  Procedures  . EKG 12-Lead   Medication Changes: Meds ordered this encounter  Medications  . atorvastatin (LIPITOR) 80 MG tablet    Sig: Take 1 tablet (80 mg total) by mouth daily  at 6 PM.    Dispense:  90 tablet    Refill:  3    Signed, Richardson Dopp, PA-C  07/07/2019 9:23 AM    [facility] [place], [place], [place]  [address] Phone: [phone]; Fax: [phone]

## 2019-07-07 ENCOUNTER — Other Ambulatory Visit: Payer: Self-pay

## 2019-07-07 ENCOUNTER — Ambulatory Visit: Payer: Medicare Other | Admitting: Physician Assistant

## 2019-07-07 ENCOUNTER — Encounter: Payer: Self-pay | Admitting: Physician Assistant

## 2019-07-07 VITALS — BP 130/60 | HR 58 | Ht 70.0 in | Wt 225.8 lb

## 2019-07-07 DIAGNOSIS — I10 Essential (primary) hypertension: Secondary | ICD-10-CM | POA: Diagnosis not present

## 2019-07-07 MED ORDER — ATORVASTATIN CALCIUM 80 MG PO TABS: 80.0000 mg | ORAL_TABLET | Freq: Every day | ORAL | 3 refills | Status: DC

## 2019-07-07 NOTE — Patient Instructions (Addendum)
Medication Instructions:   Your physician has recommended you make the following change in your medication:   1) Stop Pantoprazole (Protonix)  *If you need a refill on your cardiac medications before your next appointment, please call your pharmacy*  Lab Work:  None ordered today  If you have labs (blood work) drawn today and your tests are completely normal, you will receive your results only by: Marland Kitchen MyChart Message (if you have MyChart) OR . A paper copy in the mail If you have any lab test that is abnormal or we need to change your treatment, we will call you to review the results.  Testing/Procedures:  None ordered today  Follow-Up: At [facility], you and your health needs are our priority.  As part of our continuing mission to provide you with exceptional heart care, we have created designated Provider Care Teams.  These Care Teams include your primary Cardiologist (physician) and Advanced Practice Providers (APPs -  Physician Assistants and Nurse Practitioners) who all work together to provide you with the care you need, when you need it.  We recommend signing up for the patient portal called "MyChart".  Sign up information is provided on this After Visit Summary.  MyChart is used to connect with patients for Virtual Visits (Telemedicine).  Patients are able to view lab/test results, encounter notes, upcoming appointments, etc.  Non-urgent messages can be sent to your provider as well.   To learn more about what you can do with MyChart, go to [email or web address].    Your next appointment:   6 month(s)  The format for your next appointment:   In Person  Provider:   Sherren Mocha, MD  Follow

## 2019-12-28 ENCOUNTER — Telehealth: Payer: Self-pay | Admitting: Physician Assistant

## 2019-12-28 NOTE — Telephone Encounter (Signed)
Encounter not needed

## 2020-01-06 ENCOUNTER — Ambulatory Visit: Payer: Medicare Other | Admitting: Physician Assistant

## 2020-01-14 ENCOUNTER — Other Ambulatory Visit: Payer: Self-pay | Admitting: Cardiovascular Disease

## 2020-01-25 ENCOUNTER — Ambulatory Visit: Payer: Medicare Other | Admitting: Physician Assistant

## 2020-01-25 ENCOUNTER — Encounter: Payer: Self-pay | Admitting: Physician Assistant

## 2020-01-25 ENCOUNTER — Other Ambulatory Visit: Payer: Self-pay

## 2020-01-25 VITALS — BP 174/90 | HR 61 | Ht 70.0 in | Wt 238.4 lb

## 2020-01-25 DIAGNOSIS — I2510 Atherosclerotic heart disease of native coronary artery without angina pectoris: Secondary | ICD-10-CM

## 2020-01-25 DIAGNOSIS — I10 Essential (primary) hypertension: Secondary | ICD-10-CM

## 2020-01-25 DIAGNOSIS — E785 Hyperlipidemia, unspecified: Secondary | ICD-10-CM

## 2020-01-25 NOTE — Patient Instructions (Signed)
Medication Instructions:  Your physician recommends that you continue on your current medications as directed. Please refer to the Current Medication list given to you today.  *If you need a refill on your cardiac medications before your next appointment, please call your pharmacy*   Lab Work: None ordered  If you have labs (blood work) drawn today and your tests are completely normal, you will receive your results only by:  [place] (if you have MyChart) OR  A paper copy in the mail If you have any lab test that is abnormal or we need to change your treatment, we will call you to review the results.   Testing/Procedures: None ordered   Follow-Up: At [facility], you and your health needs are our priority.  As part of our continuing mission to provide you with exceptional heart care, we have created designated Provider Care Teams.  These Care Teams include your primary Cardiologist (physician) and Advanced Practice Providers (APPs -  Physician Assistants and Nurse Practitioners) who all work together to provide you with the care you need, when you need it.  We recommend signing up for the patient portal called "MyChart".  Sign up information is provided on this After Visit Summary.  MyChart is used to connect with patients for Virtual Visits (Telemedicine).  Patients are able to view lab/test results, encounter notes, upcoming appointments, etc.  Non-urgent messages can be sent to your provider as well.   To learn more about what you can do with MyChart, go to [email or web address].    Your next appointment:   6 month(s)  The format for your next appointment:   In Person  Provider:   You may see Sherren Mocha, MD or one of the following Advanced Practice Providers on your designated Care Team:    Robbie Lis, [place]    Other Instructions

## 2020-01-25 NOTE — Progress Notes (Signed)
Cardiology Office Note    Date:  01/25/2020   ID:  Marcus Peterson, DOB [birth year], MRN [id number]  PCP:  Lajean Manes, MD  Cardiologist:  Dr. Burt Knack   Chief Complaint: 6  Months follow up  History of Present Illness:   Marcus Peterson is a 69 y.o. male with hx of CAD s/p Lateral STEMI 11/2018 s/p DES to LCx/OM, HTN, HLD and RA seen for follow up.   Here today for follow-up.  He stopped his Brilinta after 1 year of PCI.  He is walking 30 minutes every single day without chest discomfort or shortness of breath.  He reports intermittent upper sternal burning pressure-like sensation.  Symptom improved with nitroglycerin and sometimes with acid reflux medication.  However he did not had any recurrent pain with daily walking.  He has took sublingual nitroglycerin times twice in the past 6 months.  He denies orthopnea, PND, syncope, lower extremity edema.   Past Medical History:  Diagnosis Date  . Arthritis   . CAD in native artery    a. cath 11/2018 - Severe left circumflex/obtuse marginal stenosis s/p DES to both. 50% LAD>> medical therapy   . Cancer ([place])    melanoma on right arm   . Central serous choroidopathy   . Coronary artery disease   . Erectile dysfunction    Situation reaction  . GERD (gastroesophageal reflux disease)   . Hyperlipidemia   . Hypertension   . MI (myocardial infarction) ([place]) 12/03/2018  . Nonspecific abnormal unspecified cardiovascular function study   . Rheumatic arteritis   . Right wrist fracture   . Shingles 2010    Past Surgical History:  Procedure Laterality Date  . adenomotous colon polyp N/A 2001 and 2012  . CARDIAC CATHETERIZATION    . COLONOSCOPY WITH PROPOFOL N/A 11/22/2015   Procedure: COLONOSCOPY WITH PROPOFOL;  Surgeon: Garlan Fair, MD;  Location: [facility];  Service: Endoscopy;  Laterality: N/A;  . CORONARY ANGIOPLASTY    . CORONARY/GRAFT ACUTE MI REVASCULARIZATION N/A 12/03/2018   Procedure: CORONARY/GRAFT ACUTE MI  REVASCULARIZATION;  Surgeon: Sherren Mocha, MD;  Location: [facility];  Service: Cardiovascular;  Laterality: N/A;  . HERNIA REPAIR  01/2011   RIH  . JOINT REPLACEMENT     left knee  . LEFT HEART CATH AND CORONARY ANGIOGRAPHY N/A 12/03/2018   Procedure: LEFT HEART CATH AND CORONARY ANGIOGRAPHY;  Surgeon: Sherren Mocha, MD;  Location: [facility];  Service: Cardiovascular;  Laterality: N/A;  . LEFT HEART CATHETERIZATION WITH CORONARY ANGIOGRAM N/A 11/05/2012   Procedure: LEFT HEART CATHETERIZATION WITH CORONARY ANGIOGRAM;  Surgeon: Jettie Booze, MD;  Location: [facility]ATH [facility];  Service: Cardiovascular;  Laterality: N/A;  . left knee replacement Left    contracture, L knee replacement  . s/p arthroscopic surgery right elbow Right   . SPINE SURGERY     lumbar fusion    Current Medications: Prior to Admission medications   Medication Sig Start Date End Date Taking? Authorizing Provider  amLODipine (NORVASC) 5 MG tablet Take 1 tablet (5 mg total) by mouth daily. 04/06/19 07/05/19  Richardson Dopp T, PA-C  aspirin EC 81 MG EC tablet Take 1 tablet (81 mg total) by mouth daily. 12/06/18   Leanor Kail, PA  atorvastatin (LIPITOR) 80 MG tablet Take 1 tablet (80 mg total) by mouth daily at 6 PM. 07/07/19   Kathlen Mody, Nicki Reaper T, PA-C  benazepril (LOTENSIN) 40 MG tablet TAKE 1 TABLET BY MOUTH  DAILY 01/14/20  Sherren Mocha, MD  Calcium Carbonate-Vitamin D (CALCIUM + D PO) Take 1 tablet by mouth daily.     [provider]  folic acid (FOLVITE) 1 MG tablet Take 1 mg by mouth daily.    [provider]  inFLIXimab-abda (RENFLEXIS IV) Inject into the vein. Takes injection every 8 weeks for RA    [provider]  methotrexate 2.5 MG tablet Take 17.5 mg by mouth once a week. On Fridays    [provider]  metoprolol succinate (TOPROL-XL) 50 MG 24 hr tablet Take 50 mg by mouth daily. Take with or immediately following a meal.    [provider]   Multiple Vitamin (MULTIVITAMIN) tablet Take 1 tablet by mouth daily.      [provider]  nitroGLYCERIN (NITROSTAT) 0.4 MG SL tablet Place 1 tablet (0.4 mg total) under the tongue every 5 (five) minutes as needed for chest pain. 04/06/19   Richardson Dopp T, PA-C  sildenafil (REVATIO) 20 MG tablet Take 20 mg by mouth as needed (erectile dysfuction).    [provider]  ticagrelor (BRILINTA) 90 MG TABS tablet Take 1 tablet (90 mg total) by mouth 2 (two) times daily. 12/05/18   Leanor Kail, PA    Allergies:   Patient has no known allergies.   Social History   Socioeconomic History  . Marital status: Married    Spouse name: Deniese  . Number of children: 3  . Years of education: 2  . Highest education level: High school graduate  Occupational History  . Occupation: Retired  Tobacco Use  . Smoking status: Never Smoker  . Smokeless tobacco: Former Systems developer    Types: Secondary school teacher  . Vaping Use: Never used  Substance and Sexual Activity  . Alcohol use: No  . Drug use: No  . Sexual activity: Yes  Other Topics Concern  . Not on file  Social History Narrative  . Not on file   Social Determinants of Health   Financial Resource Strain:   . Difficulty of Paying Living Expenses: Not on file  Food Insecurity:   . Worried About Charity fundraiser in the Last Year: Not on file  . Ran Out of Food in the Last Year: Not on file  Transportation Needs:   . Lack of Transportation (Medical): Not on file  . Lack of Transportation (Non-Medical): Not on file  Physical Activity:   . Days of Exercise per Week: Not on file  . Minutes of Exercise per Session: Not on file  Stress:   . Feeling of Stress : Not on file  Social Connections:   . Frequency of Communication with Friends and Family: Not on file  . Frequency of Social Gatherings with Friends and Family: Not on file  . Attends Religious Services: Not on file  . Active Member of Clubs or Organizations: Not on  file  . Attends Archivist Meetings: Not on file  . Marital Status: Not on file     Family History:  The patient's family history includes Cancer in his mother; Heart disease in his father.   ROS:   Please see the history of present illness.    ROS All other systems reviewed and are negative.   PHYSICAL EXAM:   VS:  BP (!) 174/90   Pulse 61   Ht 5\' 10"  (1.778 m)   Wt 238 lb 6.4 oz (108.1 kg)   SpO2 98%   BMI 34.21 kg/m  GEN: Well nourished, well developed, in no acute distress  HEENT: normal  Neck: no JVD, carotid bruits, or masses Cardiac: RRR; no murmurs, rubs, or gallops,no edema  Respiratory:  clear to auscultation bilaterally, normal work of breathing GI: soft, nontender, nondistended, + BS MS: no deformity or atrophy  Skin: warm and dry, no rash Neuro:  Alert and Oriented x 3, Strength and sensation are intact Psych: euthymic mood, full affect  Wt Readings from Last 3 Encounters:  01/25/20 238 lb 6.4 oz (108.1 kg)  07/07/19 225 lb 12.8 oz (102.4 kg)  04/06/19 218 lb (98.9 kg)      Studies/Labs Reviewed:   EKG:  EKG is not ordered today.  Recent Labs: 01/26/2019: ALT 25 04/06/2019: BUN 13; Creatinine, Ser 0.89; Potassium 4.3; Sodium 139   Lipid Panel    Component Value Date/Time   CHOL 103 01/26/2019 0942   TRIG 158 (H) 01/26/2019 0942   HDL 30 (L) 01/26/2019 0942   CHOLHDL 3.4 01/26/2019 0942   CHOLHDL 5.7 12/04/2018 0216   VLDL 53 (H) 12/04/2018 0216   LDLCALC 46 01/26/2019 0942    Additional studies/ records that were reviewed today include:   Echocardiogram: 11/2018 . The left ventricle has normal systolic function with an ejection  fraction of 60-65%. The cavity size was normal. Left ventricular diastolic  Doppler parameters are consistent with impaired relaxation. Normal wall  motion.  2. The right ventricle has normal systolic function. The cavity was  normal. There is no increase in right ventricular wall thickness. Right   ventricular systolic pressure could not be assessed.  3. The aorta is normal unless otherwise noted.   CORONARY/GRAFT ACUTE MI REVASCULARIZATION  11/2018  LEFT HEART CATH AND CORONARY ANGIOGRAPHY  Conclusion    A drug-eluting stent was successfully placed using a STENT SYNERGY DES 2.5X24.  Post intervention, there is a 0% residual stenosis.  A drug-eluting stent was successfully placed using a STENT SYNERGY DES 2.5X24.  Post intervention, there is a 0% residual stenosis.  The left ventricular ejection fraction is 55-65% by visual estimate.  LV end diastolic pressure is normal.  The left ventricular systolic function is normal.   1.  Severe left circumflex/obtuse marginal stenosis treated successfully with PCI using a 2.5 x 24 mm Synergy DES 2.  Moderate proximal LAD stenosis estimated at 50% 3.  Widely patent left main and RCA with no significant stenosis 4.  Normal LV systolic function with normal LVEDP, LVEF is estimated at 55 to 60%  Recommendations: If no complications arise, consider fast-track discharge.  Patient with successful PCI and preserved LV function.   ASSESSMENT & PLAN:    1. CAD s/p DES to LCx/OM -No exertional angina.  Continue to monitor symptoms.  Continue aspirin, statin, beta-blocker and ACE.  2. HTN -Initially elevated.  Repeat check by myself 134/80.  Systolic blood pressure runs in 130s at home. -No change in therapy  3. HLD -Continue Lipitor 80 mg daily -We will have blood work by PCP during yearly physical next month.    Medication Adjustments/Labs and Tests Ordered: Current medicines are reviewed at length with the patient today.  Concerns regarding medicines are outlined above.  Medication changes, Labs and Tests ordered today are listed in the Patient Instructions below. Patient Instructions  Medication Instructions:  Your physician recommends that you continue on your current medications as directed. Please refer to the Current  Medication list given to you today.  *If you need a refill on your cardiac medications  before your next appointment, please call your pharmacy*   Lab Work: None ordered  If you have labs (blood work) drawn today and your tests are completely normal, you will receive your results only by: Marland Kitchen MyChart Message (if you have MyChart) OR . A paper copy in the mail If you have any lab test that is abnormal or we need to change your treatment, we will call you to review the results.   Testing/Procedures: None ordered   Follow-Up: At [facility], you and your health needs are our priority.  As part of our continuing mission to provide you with exceptional heart care, we have created designated Provider Care Teams.  These Care Teams include your primary Cardiologist (physician) and Advanced Practice Providers (APPs -  Physician Assistants and Nurse Practitioners) who all work together to provide you with the care you need, when you need it.  We recommend signing up for the patient portal called "MyChart".  Sign up information is provided on this After Visit Summary.  MyChart is used to connect with patients for Virtual Visits (Telemedicine).  Patients are able to view lab/test results, encounter notes, upcoming appointments, etc.  Non-urgent messages can be sent to your provider as well.   To learn more about what you can do with MyChart, go to [email or web address].    Your next appointment:   6 month(s)  The format for your next appointment:   In Person  Provider:   You may see Sherren Mocha, MD or one of the following Advanced Practice Providers on your designated Care Team:    Robbie Lis, PA-C    Other Instructions      Signed, Leanor Kail, [place]  01/25/2020 8:31 AM    [address], [place], [place]  [address] Phone: [phone]; Fax: [phone]

## 2020-05-19 DIAGNOSIS — M0589 Other rheumatoid arthritis with rheumatoid factor of multiple sites: Secondary | ICD-10-CM | POA: Diagnosis not present

## 2020-06-22 ENCOUNTER — Other Ambulatory Visit: Payer: Self-pay | Admitting: Physician Assistant

## 2020-07-21 DIAGNOSIS — M0589 Other rheumatoid arthritis with rheumatoid factor of multiple sites: Secondary | ICD-10-CM | POA: Diagnosis not present

## 2020-07-21 DIAGNOSIS — Z79899 Other long term (current) drug therapy: Secondary | ICD-10-CM | POA: Diagnosis not present

## 2020-07-24 NOTE — Progress Notes (Signed)
Cardiology Office Note:    Date:  07/25/2020   ID:  Marcus Peterson, DOB 1950-11-17, MRN [id number]  PCP:  Lajean Manes, [facility]  Cardiologist:  Sherren Mocha, MD  Advanced Practice Provider:  Liliane Shi, PA-C Electrophysiologist:  None       Referring MD: Lajean Manes, MD   Chief Complaint:  Follow-up (CAD)    Patient Profile:     Marcus Peterson is a 70 y.o. male with:   Coronary artery disease  ? S/p Lat STEMI 8.2020 tx with DES to LCx/OM ? Echocardiogram 11/2018: EF 60-65  Hypertension   Rheumatoid arthritis   Mixed hyperlipidemia  GERD  Prior CV studies:  Echocardiogram 12/04/2018 EF 60-65, Gr 1 DD, no RWMA  Cardiac catheterization 12/03/2018 LM irregs LAD prox 50, mid 50 LCx mid 70; OM2 95 RCA mild dz  EF 55-65 PCI: 2.5 x 24 Synergy DES to LCx/OM2     History of Present Illness:    Marcus Peterson was last seen in 10/21 by Leanor Kail, PA-C.  He returns for f/u.  He is here alone.  He continues to note symptoms of discomfort in his chest, especially after eating.  He takes nitroglycerin for it.  There has not really been any change in the symptoms.  His symptoms with his myocardial infarction felt like indigestion.  He has not had significant shortness of breath, orthopnea, syncope.  He does get lightheaded when he stands quickly sometimes.  He has some dependent pedal edema.       Past Medical History:  Diagnosis Date  . Arthritis   . CAD in native artery    a. cath 11/2018 - Severe left circumflex/obtuse marginal stenosis s/p DES to both. 50% LAD>> medical therapy   . Cancer ([place])    melanoma on right arm   . Central serous choroidopathy   . Coronary artery disease   . Erectile dysfunction    Situation reaction  . GERD (gastroesophageal reflux disease)   . Hyperlipidemia   . Hypertension   . MI (myocardial infarction) ([place]) 12/03/2018  . Nonspecific abnormal unspecified cardiovascular  function study   . Rheumatic arteritis   . Right wrist fracture   . Shingles 2010    Current Medications: Current Meds  Medication Sig  . amLODipine (NORVASC) 5 MG tablet Take 5 mg by mouth daily.  Marland Kitchen aspirin EC 81 MG EC tablet Take 1 tablet (81 mg total) by mouth daily.  Marland Kitchen atorvastatin (LIPITOR) 80 MG tablet TAKE 1 TABLET(80 MG) BY MOUTH DAILY AT 6 PM  . benazepril (LOTENSIN) 40 MG tablet TAKE 1 TABLET BY MOUTH  DAILY  . Calcium Carbonate-Vitamin D (CALCIUM + D PO) Take 1 tablet by mouth daily.  . folic acid (FOLVITE) 1 MG tablet Take 1 mg by mouth daily.  Marland Kitchen inFLIXimab-abda (RENFLEXIS IV) Inject into the vein. Takes injection every 8 weeks for RA  . methotrexate 2.5 MG tablet Take 17.5 mg by mouth once a week. On Fridays  . metoprolol succinate (TOPROL-XL) 50 MG 24 hr tablet Take 50 mg by mouth daily. Take with or immediately following a meal.  . Multiple Vitamin (MULTIVITAMIN) tablet Take 1 tablet by mouth daily.  . nitroGLYCERIN (NITROSTAT) 0.4 MG SL tablet Place 1 tablet (0.4 mg total) under the tongue every 5 (five) minutes as needed for chest pain.  . sildenafil (REVATIO) 20 MG tablet Take 20 mg by mouth as needed (erectile dysfuction).  Allergies:   Patient has no known allergies.   Social History   Tobacco Use  . Smoking status: Never Smoker  . Smokeless tobacco: Former Systems developer    Types: Secondary school teacher  . Vaping Use: Never used  Substance Use Topics  . Alcohol use: No  . Drug use: No     Family Hx: The patient's family history includes Cancer in his mother; Heart disease in his father.  ROS   EKGs/Labs/Other Test Reviewed:    EKG:  EKG is   ordered today.  The ekg ordered today demonstrates sinus bradycardia, HR 56, normal axis, first-degree AV block, PR 266, QTc 413, no change from prior tracing  Recent Labs: No results found for requested labs within last 8760 hours.   Recent Lipid Panel Lab Results  Component Value Date/Time   CHOL 103 01/26/2019 09:42  AM   TRIG 158 (H) 01/26/2019 09:42 AM   HDL 30 (L) 01/26/2019 09:42 AM   CHOLHDL 3.4 01/26/2019 09:42 AM   CHOLHDL 5.7 12/04/2018 02:16 AM   LDLCALC 46 01/26/2019 09:42 AM    Labs obtained through [facility] - personally reviewed and interpreted: 03/22/2020:  TC 122, HDL 34, LDL 57, Trig 186, Hgb 14.9, SCr 1.03, ALT 31   Risk Assessment/Calculations:      Physical Exam:    VS:  BP 130/64   Pulse (!) 56   Ht 5\' 10"  (1.778 m)   Wt 244 lb 6.4 oz (110.9 kg)   SpO2 97%   BMI 35.07 kg/m     Wt Readings from Last 3 Encounters:  07/25/20 244 lb 6.4 oz (110.9 kg)  01/25/20 238 lb 6.4 oz (108.1 kg)  07/07/19 225 lb 12.8 oz (102.4 kg)     Constitutional:      Appearance: Healthy appearance. Not in distress.  Neck:     Vascular: No JVR. JVD normal.  Pulmonary:     Effort: Pulmonary effort is normal.     Breath sounds: No wheezing. No rales.  Cardiovascular:     Normal rate. Regular rhythm. Normal S1. Normal S2.     Murmurs: There is no murmur.  Edema:    Peripheral edema absent.  Abdominal:     Palpations: Abdomen is soft. There is no hepatomegaly.  Skin:    General: Skin is warm and dry.  Neurological:     General: No focal deficit present.     Mental Status: Alert and oriented to person, place and time.     Cranial Nerves: Cranial nerves are intact.         ASSESSMENT & PLAN:    1. Coronary artery disease involving native coronary artery of native heart without angina pectoris 2. Precordial pain History of lateral STEMI in 11/2018 treated with DES to the LCx/OM.  He did have residual moderate disease in LAD.  He notes chest symptoms after meals.  This is a fairly chronic symptom.  His EKG is unchanged.  His anginal equivalent felt like indigestion to him.  I have recommended proceeding with stress testing to rule out the possibility of ischemia.  Continue current medications which include amlodipine, aspirin, atorvastatin, metoprolol succinate.  Follow-up in 6 months or  sooner if stress test is abnormal.  3. Essential hypertension The patient's blood pressure is controlled on his current regimen.  Continue current therapy.   4. Hyperlipidemia LDL goal <70 LDL optimal on most recent lab work.  Continue current Rx.      Shared Decision Making/Informed  Consent The risks [chest pain, shortness of breath, cardiac arrhythmias, dizziness, blood pressure fluctuations, myocardial infarction, stroke/transient ischemic attack, nausea, vomiting, allergic reaction, radiation exposure, metallic taste sensation and life-threatening complications (estimated to be 1 in 10,000)], benefits (risk stratification, diagnosing coronary artery disease, treatment guidance) and alternatives of a nuclear stress test were discussed in detail with Marcus Peterson and he agrees to proceed.       Dispo:  Return in about 6 months (around 01/24/2021) for Routine 6 month follow up with Dr. Burt Knack or Richardson Dopp, PA.   Medication Adjustments/Labs and Tests Ordered: Current medicines are reviewed at length with the patient today.  Concerns regarding medicines are outlined above.  Tests Ordered: Orders Placed This Encounter  Procedures  . Cardiac Stress Test: Informed Consent Details: Physician/Practitioner Attestation; Transcribe to consent form and obtain patient signature  . Cardiac Stress Test: Informed Consent Details: Physician/Practitioner Attestation; Transcribe to consent form and obtain patient signature  . Myocardial Perfusion Imaging  . EKG 12-Lead   Medication Changes: No orders of the defined types were placed in this encounter.   Signed, Richardson Dopp, PA-C  07/25/2020 8:54 AM    [facility] [address], [place], [place]  [address] Phone: [phone]; Fax: [phone]

## 2020-07-25 ENCOUNTER — Ambulatory Visit: Payer: Medicare Other | Admitting: Physician Assistant

## 2020-07-25 ENCOUNTER — Other Ambulatory Visit: Payer: Self-pay

## 2020-07-25 ENCOUNTER — Encounter: Payer: Self-pay | Admitting: Physician Assistant

## 2020-07-25 ENCOUNTER — Encounter (INDEPENDENT_AMBULATORY_CARE_PROVIDER_SITE_OTHER): Payer: Self-pay

## 2020-07-25 VITALS — BP 130/64 | HR 56 | Ht 70.0 in | Wt 244.4 lb

## 2020-07-25 DIAGNOSIS — R072 Precordial pain: Secondary | ICD-10-CM | POA: Diagnosis not present

## 2020-07-25 DIAGNOSIS — I10 Essential (primary) hypertension: Secondary | ICD-10-CM

## 2020-07-25 DIAGNOSIS — I2510 Atherosclerotic heart disease of native coronary artery without angina pectoris: Secondary | ICD-10-CM | POA: Diagnosis not present

## 2020-07-25 DIAGNOSIS — E785 Hyperlipidemia, unspecified: Secondary | ICD-10-CM

## 2020-07-25 NOTE — Patient Instructions (Signed)
Medication Instructions:  Your physician recommends that you continue on your current medications as directed. Please refer to the Current Medication list given to you today.  *If you need a refill on your cardiac medications before your next appointment, please call your pharmacy*   Lab Work: -None  If you have labs (blood work) drawn today and your tests are completely normal, you will receive your results only by: Marland Kitchen MyChart Message (if you have MyChart) OR . A paper copy in the mail If you have any lab test that is abnormal or we need to change your treatment, we will call you to review the results.   Testing/Procedures: You are scheduled for a Myocardial Perfusion Imaging Study on Wednesday, April 20 at 7:45 am.   Please arrive 15 minutes prior to your appointment time for registration and insurance purposes.   The test will take approximately 3 to 4 hours to complete; you may bring reading material. If someone comes with you to your appointment, they will need to remain in the main lobby due to limited space in the testing area.   How to prepare for your Myocardial Perfusion test:   Do not eat or drink 3 hours prior to your test, except you may have water.    Do not consume products containing caffeine (regular or decaffeinated) 12 hours prior to your test (ex: coffee, chocolate, soda, tea)   Do bring a list of your current medications with you. If not listed below, you may take your medications as normal.   Bring any held medication to your appointment, as you may be required to take it once the test is complete.   Do wear comfortable clothes (no dresses or overalls) and walking shoes. Tennis shoes are preferred. No heels or open toed shoes.  Do not wear cologne, perfume, aftershave or lotions (deodorant is allowed).   If these instructions are not followed, you test will have to be rescheduled.   Please report to [address] for your test. If you  have questions or concerns about your appointment, please call the Nuclear Lab at [phone].  If you cannot keep your appointment, please provide 24 hour notification to the Nuclear lab to avoid a possible $50 charge to your account.        Follow-Up: At [facility], you and your health needs are our priority.  As part of our continuing mission to provide you with exceptional heart care, we have created designated Provider Care Teams.  These Care Teams include your primary Cardiologist (physician) and Advanced Practice Providers (APPs -  Physician Assistants and Nurse Practitioners) who all work together to provide you with the care you need, when you need it.  We recommend signing up for the patient portal called "MyChart".  Sign up information is provided on this After Visit Summary.  MyChart is used to connect with patients for Virtual Visits (Telemedicine).  Patients are able to view lab/test results, encounter notes, upcoming appointments, etc.  Non-urgent messages can be sent to your provider as well.   To learn more about what you can do with MyChart, go to [email or web address].    Your next appointment:   6 month(s)  The format for your next appointment:   In Person  Provider:   You may see Sherren Mocha, MD or one of the following Advanced Practice Providers on your designated Care Team:    Richardson Dopp, PA-C    Other Instructions Your physician wants you to  follow-up in: 6 months with Dr. Burt Knack or Richardson Dopp, PA.  You will receive a reminder letter in the mail two months in advance. If you don't receive a letter, please call our office to schedule the follow-up appointment.

## 2020-07-28 ENCOUNTER — Telehealth (HOSPITAL_COMMUNITY): Payer: Self-pay | Admitting: *Deleted

## 2020-07-28 NOTE — Telephone Encounter (Signed)
Left message on voicemail per DPR in reference to upcoming appointment scheduled on 08/03/20 with detailed instructions given per Myocardial Perfusion Study Information Sheet for the test. LM to arrive 15 minutes early, and that it is imperative to arrive on time for appointment to keep from having the test rescheduled. If you need to cancel or reschedule your appointment, please call the office within 24 hours of your appointment. Failure to do so may result in a cancellation of your appointment, and a $50 no show fee. Phone number given for call back for any questions. Kirstie Peri

## 2020-08-03 ENCOUNTER — Other Ambulatory Visit: Payer: Self-pay

## 2020-08-03 ENCOUNTER — Ambulatory Visit (HOSPITAL_COMMUNITY): Payer: Medicare Other | Attending: Cardiovascular Disease

## 2020-08-03 DIAGNOSIS — R072 Precordial pain: Secondary | ICD-10-CM | POA: Diagnosis not present

## 2020-08-03 LAB — MYOCARDIAL PERFUSION IMAGING
LV dias vol: 129 mL (ref 62–150)
LV sys vol: 45 mL
Peak HR: 53 {beats}/min
Rest HR: 44 {beats}/min
SDS: 2
SRS: 1
SSS: 3
TID: 0.95

## 2020-08-03 MED ORDER — TECHNETIUM TC 99M TETROFOSMIN IV KIT
10.1000 | PACK | Freq: Once | INTRAVENOUS | Status: AC | PRN
  Administered 2020-08-03: 10.1 via INTRAVENOUS
  Filled 2020-08-03: qty 11

## 2020-08-03 MED ORDER — TECHNETIUM TC 99M TETROFOSMIN IV KIT
31.1000 | PACK | Freq: Once | INTRAVENOUS | Status: AC | PRN
Start: 2020-08-03 — End: 2020-08-03
  Administered 2020-08-03: 31.1 via INTRAVENOUS
  Filled 2020-08-03: qty 32

## 2020-08-03 MED ORDER — REGADENOSON 0.4 MG/5ML IV SOLN
0.4000 mg | Freq: Once | INTRAVENOUS | Status: AC
  Administered 2020-08-03: 0.4 mg via INTRAVENOUS

## 2020-08-11 DIAGNOSIS — Z79899 Other long term (current) drug therapy: Secondary | ICD-10-CM | POA: Diagnosis not present

## 2020-08-11 DIAGNOSIS — M0589 Other rheumatoid arthritis with rheumatoid factor of multiple sites: Secondary | ICD-10-CM | POA: Diagnosis not present

## 2020-08-11 DIAGNOSIS — M2550 Pain in unspecified joint: Secondary | ICD-10-CM | POA: Diagnosis not present

## 2020-08-11 DIAGNOSIS — M159 Polyosteoarthritis, unspecified: Secondary | ICD-10-CM | POA: Diagnosis not present

## 2020-09-15 DIAGNOSIS — R5383 Other fatigue: Secondary | ICD-10-CM | POA: Diagnosis not present

## 2020-09-15 DIAGNOSIS — Z79899 Other long term (current) drug therapy: Secondary | ICD-10-CM | POA: Diagnosis not present

## 2020-09-15 DIAGNOSIS — M0589 Other rheumatoid arthritis with rheumatoid factor of multiple sites: Secondary | ICD-10-CM | POA: Diagnosis not present

## 2020-09-23 DIAGNOSIS — I10 Essential (primary) hypertension: Secondary | ICD-10-CM | POA: Diagnosis not present

## 2020-09-23 DIAGNOSIS — M069 Rheumatoid arthritis, unspecified: Secondary | ICD-10-CM | POA: Diagnosis not present

## 2020-11-10 DIAGNOSIS — M0589 Other rheumatoid arthritis with rheumatoid factor of multiple sites: Secondary | ICD-10-CM | POA: Diagnosis not present

## 2020-11-10 DIAGNOSIS — Z79899 Other long term (current) drug therapy: Secondary | ICD-10-CM | POA: Diagnosis not present

## 2020-11-23 ENCOUNTER — Other Ambulatory Visit: Payer: Self-pay | Admitting: Cardiovascular Disease

## 2021-01-05 DIAGNOSIS — M0589 Other rheumatoid arthritis with rheumatoid factor of multiple sites: Secondary | ICD-10-CM | POA: Diagnosis not present

## 2021-01-30 ENCOUNTER — Other Ambulatory Visit: Payer: Self-pay | Admitting: Physician Assistant

## 2021-02-10 DIAGNOSIS — M159 Polyosteoarthritis, unspecified: Secondary | ICD-10-CM | POA: Diagnosis not present

## 2021-02-10 DIAGNOSIS — M2550 Pain in unspecified joint: Secondary | ICD-10-CM | POA: Diagnosis not present

## 2021-02-10 DIAGNOSIS — Z79899 Other long term (current) drug therapy: Secondary | ICD-10-CM | POA: Diagnosis not present

## 2021-02-10 DIAGNOSIS — M0589 Other rheumatoid arthritis with rheumatoid factor of multiple sites: Secondary | ICD-10-CM | POA: Diagnosis not present

## 2021-03-02 DIAGNOSIS — M0589 Other rheumatoid arthritis with rheumatoid factor of multiple sites: Secondary | ICD-10-CM | POA: Diagnosis not present

## 2021-03-02 DIAGNOSIS — Z79899 Other long term (current) drug therapy: Secondary | ICD-10-CM | POA: Diagnosis not present

## 2021-04-04 ENCOUNTER — Ambulatory Visit: Payer: Medicare Other | Admitting: Physician Assistant

## 2021-04-13 DIAGNOSIS — M069 Rheumatoid arthritis, unspecified: Secondary | ICD-10-CM | POA: Diagnosis not present

## 2021-04-13 DIAGNOSIS — I209 Angina pectoris, unspecified: Secondary | ICD-10-CM | POA: Diagnosis not present

## 2021-04-13 DIAGNOSIS — Z1389 Encounter for screening for other disorder: Secondary | ICD-10-CM | POA: Diagnosis not present

## 2021-04-13 DIAGNOSIS — E7800 Pure hypercholesterolemia, unspecified: Secondary | ICD-10-CM | POA: Diagnosis not present

## 2021-04-13 DIAGNOSIS — D126 Benign neoplasm of colon, unspecified: Secondary | ICD-10-CM | POA: Diagnosis not present

## 2021-04-13 DIAGNOSIS — Z79899 Other long term (current) drug therapy: Secondary | ICD-10-CM | POA: Diagnosis not present

## 2021-04-13 DIAGNOSIS — Z0000 Encounter for general adult medical examination without abnormal findings: Secondary | ICD-10-CM | POA: Diagnosis not present

## 2021-04-13 DIAGNOSIS — I10 Essential (primary) hypertension: Secondary | ICD-10-CM | POA: Diagnosis not present

## 2021-04-18 NOTE — Progress Notes (Signed)
Cardiology Office Note    Date:  04/25/2021   ID:  CHASKA HAGGER, DOB July 18, 1950, MRN [id number]   PCP:  Lajean Manes, [place]  Cardiologist:  Sherren Mocha, MD   Advanced Practice Provider:  Liliane Shi, PA-C Electrophysiologist:  None   [id number]   Chief Complaint  Patient presents with   Follow-up    History of Present Illness:  Marcus Peterson is a 71 y.o. male with history of CAD status post STEMI 11/2018 treated with DES to the circumflex/OM, residual moderate disease in the LAD LVEF 60 to 65% on follow-up echo, hypertension, HLD, GERD, rheumatoid arthritis.  Patient last saw Richardson Dopp, PA-C 07/25/2020 he was having some chest discomfort after meals.  He had a normal NST 08/03/2020 EF 65%.  Patient comes in for f/u. Denies chest pain, palpitations,dyspnea, edema. Mild dizziness if gets up quickly. GERD controlled with smaller more freq meals. No regular exercise.works on a farm-horses, cuts wood. BP high today, at home 120-140/70-80. Eats a lot of canned soup, ham, bologna, hot dogs.     Past Medical History:  Diagnosis Date   Arthritis    CAD in native artery    a. cath 11/2018 - Severe left circumflex/obtuse marginal stenosis s/p DES to both. 50% LAD>> medical therapy    Cancer ([place])    melanoma on right arm    Central serous choroidopathy    Coronary artery disease    Erectile dysfunction    Situation reaction   GERD (gastroesophageal reflux disease)    Hyperlipidemia    Hypertension    MI (myocardial infarction) ([place]) 12/03/2018   Nonspecific abnormal unspecified cardiovascular function study    Rheumatic arteritis    Right wrist fracture    Shingles 2010    Past Surgical History:  Procedure Laterality Date   adenomotous colon polyp N/A 2001 and 2012   CARDIAC CATHETERIZATION     COLONOSCOPY WITH PROPOFOL N/A 11/22/2015   Procedure: COLONOSCOPY WITH PROPOFOL;  Surgeon: Garlan Fair, MD;  Location: [facility]  ENDOSCOPY;  Service: Endoscopy;  Laterality: N/A;   CORONARY ANGIOPLASTY     CORONARY/GRAFT ACUTE MI REVASCULARIZATION N/A 12/03/2018   Procedure: CORONARY/GRAFT ACUTE MI REVASCULARIZATION;  Surgeon: Sherren Mocha, MD;  Location: [facility];  Service: Cardiovascular;  Laterality: N/A;   HERNIA REPAIR  01/2011   RIH   JOINT REPLACEMENT     left knee   LEFT HEART CATH AND CORONARY ANGIOGRAPHY N/A 12/03/2018   Procedure: LEFT HEART CATH AND CORONARY ANGIOGRAPHY;  Surgeon: Sherren Mocha, MD;  Location: [facility];  Service: Cardiovascular;  Laterality: N/A;   LEFT HEART CATHETERIZATION WITH CORONARY ANGIOGRAM N/A 11/05/2012   Procedure: LEFT HEART CATHETERIZATION WITH CORONARY ANGIOGRAM;  Surgeon: Jettie Booze, MD;  Location[facility];  Service: Cardiovascular;  Laterality: N/A;   left knee replacement Left    contracture, L knee replacement   s/p arthroscopic surgery right elbow Right    SPINE SURGERY     lumbar fusion    Current Medications: Current Meds  Medication Sig   amLODipine (NORVASC) 5 MG tablet TAKE 1 TABLET(5 MG) BY MOUTH DAILY   aspirin EC 81 MG EC tablet Take 1 tablet (81 mg total) by mouth daily.   atorvastatin (LIPITOR) 80 MG tablet TAKE 1 TABLET(80 MG) BY MOUTH DAILY AT 6 PM   benazepril (LOTENSIN) 40 MG tablet TAKE 1 TABLET BY MOUTH  DAILY   Calcium  Carbonate-Vitamin D (CALCIUM + D PO) Take 1 tablet by mouth daily.   folic acid (FOLVITE) 1 MG tablet Take 1 mg by mouth daily.   inFLIXimab-abda (RENFLEXIS IV) Inject into the vein. Takes injection every 8 weeks for RA   methotrexate 2.5 MG tablet Take 17.5 mg by mouth once a week. On Fridays   metoprolol succinate (TOPROL-XL) 50 MG 24 hr tablet Take 50 mg by mouth daily. Take with or immediately following a meal.   Multiple Vitamin (MULTIVITAMIN) tablet Take 1 tablet by mouth daily.   nitroGLYCERIN (NITROSTAT) 0.4 MG SL tablet Place 1 tablet (0.4 mg total) under the tongue every 5 (five) minutes  as needed for chest pain.   sildenafil (REVATIO) 20 MG tablet Take 20 mg by mouth as needed (erectile dysfuction).     Allergies:   Patient has no known allergies.   Social History   Socioeconomic History   Marital status: Married    Spouse name: Deniese   Number of children: 3   Years of education: 12   Highest education level: High school graduate  Occupational History   Occupation: Retired  Tobacco Use   Smoking status: Never   Smokeless tobacco: Former    Types: Chew    Quit date: 01/14/2018  Vaping Use   Vaping Use: Never used  Substance and Sexual Activity   Alcohol use: No   Drug use: No   Sexual activity: Yes  Other Topics Concern   Not on file  Social History Narrative   Not on file   Social Determinants of Health   Financial Resource Strain: Not on file  Food Insecurity: Not on file  Transportation Needs: Not on file  Physical Activity: Not on file  Stress: Not on file  Social Connections: Not on file     Family History:  The patient's  family history includes Cancer in his mother; Heart disease in his father.   ROS:   Please see the history of present illness.    ROS All other systems reviewed and are negative.   PHYSICAL EXAM:   VS:  BP (!) 158/64    Pulse (!) 55    Ht 5\' 11"  (1.803 m)    Wt 248 lb (112.5 kg)    SpO2 95%    BMI 34.59 kg/m   Physical Exam  GEN: obese, in no acute distress  Neck: no JVD, carotid bruits, or masses Cardiac:RRR; no murmurs, rubs, or gallops  Respiratory:  clear to auscultation bilaterally, normal work of breathing GI: soft, nontender, nondistended, + BS Ext: without cyanosis, clubbing, or edema, Good distal pulses bilaterally Neuro:  Alert and Oriented x 3,  Psych: euthymic mood, full affect  Wt Readings from Last 3 Encounters:  04/25/21 248 lb (112.5 kg)  08/03/20 244 lb (110.7 kg)  07/25/20 244 lb 6.4 oz (110.9 kg)      Studies/Labs Reviewed:   EKG:  EKG is not ordered today.     Recent Labs: No  results found for requested labs within last 8760 hours.   Lipid Panel    Component Value Date/Time   CHOL 103 01/26/2019 0942   TRIG 158 (H) 01/26/2019 0942   HDL 30 (L) 01/26/2019 0942   CHOLHDL 3.4 01/26/2019 0942   CHOLHDL 5.7 12/04/2018 0216   VLDL 53 (H) 12/04/2018 0216   LDLCALC 46 01/26/2019 0942    Additional studies/ records that were reviewed today include:  NST 4/20/2022Nuclear stress EF: 65%. The left ventricular ejection fraction is  normal (55-65%). There was no ST segment deviation noted during stress. The study is normal. This is a low risk study.   Normal resting and stress perfusion. No ischemia or infarction EF 65%Echocardiogram 12/04/2018 EF 60-65, Gr 1 DD, no RWMA   Cardiac catheterization 12/03/2018 LM irregs LAD prox 50, mid 44 LCx mid 70; OM2 95 RCA mild dz  EF 55-65 PCI: 2.5 x 24 Synergy DES to LCx/OM2      Risk Assessment/Calculations:         ASSESSMENT:    1. Coronary artery disease involving native coronary artery of native heart without angina pectoris   2. Essential hypertension   3. Hyperlipidemia, unspecified hyperlipidemia type   4. Chronic GERD      PLAN:  In order of problems listed above:  CAD status post STEMI 11/2018 treated with DES to the circumflex/OM with residual disease in the LAD treated medically, NST 07/2020 normal no ischemia-no further chest pain. 150 min exercise weekly.   Hypertension blood pressure high today.  Usually 130s to 140s at home.  He does get a lot of salt in his diet.  He is willing to try to cut back and call us if his blood pressure is trending over 135/85.  2 g sodium diet.  Hyperlipidemia LDL 62-12/29/22  GERD improved with eating smaller meals more frequently  Shared Decision Making/Informed Consent        Medication Adjustments/Labs and Tests Ordered: Current medicines are reviewed at length with the patient today.  Concerns regarding medicines are outlined above.  Medication changes,  Labs and Tests ordered today are listed in the Patient Instructions below. Patient Instructions  Medication Instructions:  Your physician recommends that you continue on your current medications as directed. Please refer to the Current Medication list given to you today.  *If you need a refill on your cardiac medications before your next appointment, please call your pharmacy*   Lab Work: None If you have labs (blood work) drawn today and your tests are completely normal, you will receive your results only by: [place] (if you have MyChart) OR A paper copy in the mail If you have any lab test that is abnormal or we need to change your treatment, we will call you to review the results.   Follow-Up: At [facility], you and your health needs are our priority.  As part of our continuing mission to provide you with exceptional heart care, we have created designated Provider Care Teams.  These Care Teams include your primary Cardiologist (physician) and Advanced Practice Providers (APPs -  Physician Assistants and Nurse Practitioners) who all work together to provide you with the care you need, when you need it.  We recommend signing up for the patient portal called "MyChart".  Sign up information is provided on this After Visit Summary.  MyChart is used to connect with patients for Virtual Visits (Telemedicine).  Patients are able to view lab/test results, encounter notes, upcoming appointments, etc.  Non-urgent messages can be sent to your provider as well.   To learn more about what you can do with MyChart, go to [email or web address].    Your next appointment:   1 year(s)  The format for your next appointment:   In Person  Provider:   Sherren Mocha, MD     Other Instructions Your provider recommends that you maintain 150 minutes per week of moderate aerobic activity.  Call if your Blood Pressure remains above 135/85  Two Gram Sodium Diet 2000 mg  What is  Sodium? Sodium is a mineral found naturally in many foods. The most significant source of sodium in the diet is table salt, which is about 40% sodium.  Processed, convenience, and preserved foods also contain a large amount of sodium.  The body needs only 500 mg of sodium daily to function,  A normal diet provides more than enough sodium even if you do not use salt.  Why Limit Sodium? A build up of sodium in the body can cause thirst, increased blood pressure, shortness of breath, and water retention.  Decreasing sodium in the diet can reduce edema and risk of heart attack or stroke associated with high blood pressure.  Keep in mind that there are many other factors involved in these health problems.  Heredity, obesity, lack of exercise, cigarette smoking, stress and what you eat all play a role.  General Guidelines: Do not add salt at the table or in cooking.  One teaspoon of salt contains over 2 grams of sodium. Read food labels Avoid processed and convenience foods Ask your dietitian before eating any foods not dicussed in the menu planning guidelines Consult your physician if you wish to use a salt substitute or a sodium containing medication such as antacids.  Limit milk and milk products to 16 oz (2 cups) per day.  Shopping Hints: READ LABELS!! "Dietetic" does not necessarily mean low sodium. Salt and other sodium ingredients are often added to foods during processing.    Menu Planning Guidelines Food Group Choose More Often Avoid  Beverages (see also the milk group All fruit juices, low-sodium, salt-free vegetables juices, low-sodium carbonated beverages Regular vegetable or tomato juices, commercially softened water used for drinking or cooking  Breads and Cereals Enriched white, wheat, rye and pumpernickel bread, hard rolls and dinner rolls; muffins, cornbread and waffles; most dry cereals, cooked cereal without added salt; unsalted crackers and breadsticks; low sodium or homemade bread  crumbs Bread, rolls and crackers with salted tops; quick breads; instant hot cereals; pancakes; commercial bread stuffing; self-rising flower and biscuit mixes; regular bread crumbs or cracker crumbs  Desserts and Sweets Desserts and sweets mad with mild should be within allowance Instant pudding mixes and cake mixes  Fats Butter or margarine; vegetable oils; unsalted salad dressings, regular salad dressings limited to 1 Tbs; light, sour and heavy cream Regular salad dressings containing bacon fat, bacon bits, and salt pork; snack dips made with instant soup mixes or processed cheese; salted nuts  Fruits Most fresh, frozen and canned fruits Fruits processed with salt or sodium-containing ingredient (some dried fruits are processed with sodium sulfites        Vegetables Fresh, frozen vegetables and low- sodium canned vegetables Regular canned vegetables, sauerkraut, pickled vegetables, and others prepared in brine; frozen vegetables in sauces; vegetables seasoned with ham, bacon or salt pork  Condiments, Sauces, Miscellaneous  Salt substitute with physician's approval; pepper, herbs, spices; vinegar, lemon or lime juice; hot pepper sauce; garlic powder, onion powder, low sodium soy sauce (1 Tbs.); low sodium condiments (ketchup, chili sauce, mustard) in limited amounts (1 tsp.) fresh ground horseradish; unsalted tortilla chips, pretzels, potato chips, popcorn, salsa (1/4 cup) Any seasoning made with salt including garlic salt, celery salt, onion salt, and seasoned salt; sea salt, rock salt, kosher salt; meat tenderizers; monosodium glutamate; mustard, regular soy sauce, barbecue, sauce, chili sauce, teriyaki sauce, steak sauce, Worcestershire sauce, and most flavored vinegars; canned gravy and mixes; regular condiments; salted snack foods, olives, picles, relish, horseradish sauce, catsup  Food preparation: Try these seasonings Meats:    Pork Sage, onion Serve with applesauce  Chicken Poultry  seasoning, thyme, parsley Serve with cranberry sauce  Lamb Curry powder, rosemary, garlic, thyme Serve with mint sauce or jelly  Veal Marjoram, basil Serve with current jelly, cranberry sauce  Beef Pepper, bay leaf Serve with dry mustard, unsalted chive butter  Fish [place] leaf, dill Serve with unsalted lemon butter, unsalted parsley butter  Vegetables:    Asparagus Lemon juice   Broccoli Lemon juice   Carrots Mustard dressing parsley, mint, nutmeg, glazed with unsalted butter and sugar   Green beans Marjoram, lemon juice, nutmeg,dill seed   Tomatoes Basil, marjoram, onion   Spice /blend for [employer]" 4 tsp ground thyme 1 tsp ground sage 3 tsp ground rosemary 4 tsp ground marjoram   Test your knowledge A product that says "Salt Free" may still contain sodium. True or False Garlic Powder and Hot Pepper Sauce an be used as alternative seasonings.True or False Processed foods have more sodium than fresh foods.  True or False Canned Vegetables have less sodium than froze True or False   WAYS TO DECREASE YOUR SODIUM INTAKE Avoid the use of added salt in cooking and at the table.  Table salt (and other prepared seasonings which contain salt) is probably one of the greatest sources of sodium in the diet.  Unsalted foods can gain flavor from the sweet, sour, and butter taste sensations of herbs and spices.  Instead of using salt for seasoning, try the following seasonings with the foods listed.  Remember: how you use them to enhance natural food flavors is limited only by your creativity... Allspice-Meat, fish, eggs, fruit, peas, red and yellow vegetables Almond Extract-Fruit baked goods Anise Seed-Sweet breads, fruit, carrots, beets, cottage cheese, cookies (tastes like licorice) Basil-Meat, fish, eggs, vegetables, rice, vegetables salads, soups, sauces Bay Leaf-Meat, fish, stews, poultry Burnet-Salad, vegetables (cucumber-like flavor) Caraway Seed-Bread, cookies, cottage cheese, meat,  vegetables, cheese, rice Cardamon-Baked goods, fruit, soups Celery Powder or seed-Salads, salad dressings, sauces, meatloaf, soup, bread.Do not use  celery salt Chervil-Meats, salads, fish, eggs, vegetables, cottage cheese (parsley-like flavor) Chili Power-Meatloaf, chicken cheese, corn, eggplant, egg dishes Chives-Salads cottage cheese, egg dishes, soups, vegetables, sauces Cilantro-Salsa, casseroles Cinnamon-Baked goods, fruit, pork, lamb, chicken, carrots Cloves-Fruit, baked goods, fish, pot roast, green beans, beets, carrots Coriander-Pastry, cookies, meat, salads, cheese (lemon-orange flavor) Cumin-Meatloaf, fish,cheese, eggs, cabbage,fruit pie (caraway flavor) [employer], fruit, eggs, fish, poultry, cottage cheese, vegetables Dill Seed-Meat, cottage cheese, poultry, vegetables, fish, salads, bread Fennel Seed-Bread, cookies, apples, pork, eggs, fish, beets, cabbage, cheese, Licorice-like flavor Garlic-(buds or powder) Salads, meat, poultry, fish, bread, butter, vegetables, potatoes.Do not  use garlic salt Ginger-Fruit, vegetables, baked goods, meat, fish, poultry Horseradish Root-Meet, vegetables, butter Lemon Juice or Extract-Vegetables, fruit, tea, baked goods, fish salads Mace-Baked goods fruit, vegetables, fish, poultry (taste like nutmeg) Maple Extract-Syrups Marjoram-Meat, chicken, fish, vegetables, breads, green salads (taste like Sage) Mint-Tea, lamb, sherbet, vegetables, desserts, carrots, cabbage Mustard, Dry or Seed-Cheese, eggs, meats, vegetables, poultry Nutmeg-Baked goods, fruit, chicken, eggs, vegetables, desserts Onion Powder-Meat, fish, poultry, vegetables, cheese, eggs, bread, rice salads (Do not use   Onion salt) Orange Extract-Desserts, baked goods Oregano-Pasta, eggs, cheese, onions, pork, lamb, fish, chicken, vegetables, green salads Paprika-Meat, fish, poultry, eggs, cheese, vegetables Parsley Flakes-Butter, vegetables, meat fish, poultry, eggs, bread,  salads (certain forms may   Contain sodium Pepper-Meat fish, poultry, vegetables, eggs Peppermint Extract-Desserts, baked goods Poppy Seed-Eggs, bread, cheese, fruit dressings, baked goods, noodles, vegetables, cottage  Cheese Poultry Seasoning-Poultry,veal Rosemary-Lamb, poultry, meat, fish, cauliflower, turnips,eggs bread Saffron-Rice, bread, veal, chicken, fish, eggs Sage-Meat, fish, poultry, onions, eggplant, tomateos, pork, stews Savory-Eggs, salads, poultry, meat, rice, vegetables, soups, pork Tarragon-Meat, poultry, fish, eggs, butter, vegetables (licorice-like flavor)  Thyme-Meat, poultry, fish, eggs, vegetables, (clover-like flavor), sauces, soups Tumeric-Salads, butter, eggs, fish, rice, vegetables (saffron-like flavor) Vanilla Extract-Baked goods, candy Vinegar-Salads, vegetables, meat marinades Walnut Extract-baked goods, candy   2. Choose your Foods Wisely   The following is a list of foods to avoid which are high in sodium:  Meats-Avoid all smoked, canned, salt cured, dried and kosher meat and fish as well as Anchovies   [employer] meats:Bologna, Liverwurst, Pastrami Canned meat or fish  Marinated herring Caviar    Pepperoni Corned Beef   Pizza Dried chipped beef  Salami Frozen breaded fish or meat Salt pork Frankfurters or hot dogs  Sardines Gefilte fish   Sausage Ham (boiled ham, Proscuitto Smoked butt    spiced ham)   Spam      TV Dinners Vegetables Canned vegetables (Regular) Relish Canned mushrooms  Sauerkraut Olives    Tomato juice Pickles  Bakery and Dessert Products Canned puddings  Cream pies Cheesecake   Decorated cakes Cookies  Beverages/Juices Tomato juice, regular  Gatorade   V-8 vegetable juice, regular  Breads and Cereals Biscuit mixes   Salted potato chips, corn chips, pretzels Bread stuffing mixes  Salted crackers and rolls Pancake and waffle mixes Self-rising flour  Seasonings Accent    Meat sauces Barbecue  sauce  Meat tenderizer Catsup    Monosodium glutamate (MSG) Celery salt   Onion salt Chili sauce   Prepared mustard Garlic salt   Salt, seasoned salt, sea salt Gravy mixes   Soy sauce Horseradish   Steak sauce Ketchup   Tartar sauce Lite salt    Teriyaki sauce Marinade mixes   Worcestershire sauce  Others Baking powder   Cocoa and cocoa mixes Baking soda   Commercial casserole mixes Candy-caramels, chocolate  Dehydrated soups    Bars, fudge,nougats  Instant rice and pasta mixes Canned broth or soup  Maraschino cherries Cheese, aged and processed cheese and cheese spreads  Learning Assessment Quiz  Indicated T (for True) or F (for False) for each of the following statements:  _____ Fresh fruits and vegetables and unprocessed grains are generally low in sodium _____ Water may contain a considerable amount of sodium, depending on the source _____ You can always tell if a food is high in sodium by tasting it _____ Certain laxatives my be high in sodium and should be avoided unless prescribed   by a physician or pharmacist _____ Salt substitutes may be used freely by anyone on a sodium restricted diet _____ Sodium is present in table salt, food additives and as a natural component of   most foods _____ Table salt is approximately 90% sodium _____ Limiting sodium intake may help prevent excess fluid accumulation in the body _____ On a sodium-restricted diet, seasonings such as bouillon soy sauce, and    cooking wine should be used in place of table salt _____ On an ingredient list, a product which lists monosodium glutamate as the first   ingredient is an appropriate food to include on a low sodium diet  Circle the best answer(s) to the following statements (Hint: there may be more than one correct answer)  11. On a low-sodium diet, some acceptable snack items are:    A. Olives  F. Bean dip  K. Grapefruit juice    B. Salted Pretzels G. Commercial Popcorn   L. Canned peaches    C.  Carrot Sticks  H. Bouillon   M. Unsalted nuts   D. [place] fries  I. Peanut butter crackers N. Salami   E. Sweet pickles J. Tomato Juice   O. Pizza  12.  Seasonings that may be used freely on a reduced - sodium diet include   A. Lemon wedges F.Monosodium glutamate K. Celery seed    B.Soysauce   G. Pepper   L. Mustard powder   C. Sea salt  H. Cooking wine  M. Onion flakes   D. Vinegar  E. Prepared horseradish N. Salsa   E. Sage   J. Worcestershire sauce  O. [address]     Sumner Boast, PA-C  04/25/2021 10:17 AM    [facility] [place], [place], [place]  [address] Phone: [phone]; Fax: [phone]

## 2021-04-25 ENCOUNTER — Encounter: Payer: Self-pay | Admitting: Physician Assistant

## 2021-04-25 ENCOUNTER — Other Ambulatory Visit: Payer: Self-pay

## 2021-04-25 ENCOUNTER — Ambulatory Visit: Payer: Medicare Other | Admitting: Physician Assistant

## 2021-04-25 VITALS — BP 158/64 | HR 55 | Ht 71.0 in | Wt 248.0 lb

## 2021-04-25 DIAGNOSIS — K219 Gastro-esophageal reflux disease without esophagitis: Secondary | ICD-10-CM

## 2021-04-25 DIAGNOSIS — E785 Hyperlipidemia, unspecified: Secondary | ICD-10-CM | POA: Diagnosis not present

## 2021-04-25 DIAGNOSIS — I2510 Atherosclerotic heart disease of native coronary artery without angina pectoris: Secondary | ICD-10-CM | POA: Diagnosis not present

## 2021-04-25 DIAGNOSIS — I10 Essential (primary) hypertension: Secondary | ICD-10-CM | POA: Diagnosis not present

## 2021-04-25 NOTE — Patient Instructions (Signed)
Medication Instructions:  Your physician recommends that you continue on your current medications as directed. Please refer to the Current Medication list given to you today.  *If you need a refill on your cardiac medications before your next appointment, please call your pharmacy*   Lab Work: None If you have labs (blood work) drawn today and your tests are completely normal, you will receive your results only by: [place] (if you have MyChart) OR A paper copy in the mail If you have any lab test that is abnormal or we need to change your treatment, we will call you to review the results.   Follow-Up: At [facility], you and your health needs are our priority.  As part of our continuing mission to provide you with exceptional heart care, we have created designated Provider Care Teams.  These Care Teams include your primary Cardiologist (physician) and Advanced Practice Providers (APPs -  Physician Assistants and Nurse Practitioners) who all work together to provide you with the care you need, when you need it.  We recommend signing up for the patient portal called "MyChart".  Sign up information is provided on this After Visit Summary.  MyChart is used to connect with patients for Virtual Visits (Telemedicine).  Patients are able to view lab/test results, encounter notes, upcoming appointments, etc.  Non-urgent messages can be sent to your provider as well.   To learn more about what you can do with MyChart, go to [email or web address].    Your next appointment:   1 year(s)  The format for your next appointment:   In Person  Provider:   Sherren Mocha, MD     Other Instructions Your provider recommends that you maintain 150 minutes per week of moderate aerobic activity.  Call if your Blood Pressure remains above 135/85  Two Gram Sodium Diet 2000 mg  What is Sodium? Sodium is a mineral found naturally in many foods. The most significant source of sodium in the diet is  table salt, which is about 40% sodium.  Processed, convenience, and preserved foods also contain a large amount of sodium.  The body needs only 500 mg of sodium daily to function,  A normal diet provides more than enough sodium even if you do not use salt.  Why Limit Sodium? A build up of sodium in the body can cause thirst, increased blood pressure, shortness of breath, and water retention.  Decreasing sodium in the diet can reduce edema and risk of heart attack or stroke associated with high blood pressure.  Keep in mind that there are many other factors involved in these health problems.  Heredity, obesity, lack of exercise, cigarette smoking, stress and what you eat all play a role.  General Guidelines: Do not add salt at the table or in cooking.  One teaspoon of salt contains over 2 grams of sodium. Read food labels Avoid processed and convenience foods Ask your dietitian before eating any foods not dicussed in the menu planning guidelines Consult your physician if you wish to use a salt substitute or a sodium containing medication such as antacids.  Limit milk and milk products to 16 oz (2 cups) per day.  Shopping Hints: READ LABELS!! "Dietetic" does not necessarily mean low sodium. Salt and other sodium ingredients are often added to foods during processing.    Menu Planning Guidelines Food Group Choose More Often Avoid  Beverages (see also the milk group All fruit juices, low-sodium, salt-free vegetables juices, low-sodium carbonated beverages Regular vegetable or tomato  juices, commercially softened water used for drinking or cooking  Breads and Cereals Enriched white, wheat, rye and pumpernickel bread, hard rolls and dinner rolls; muffins, cornbread and waffles; most dry cereals, cooked cereal without added salt; unsalted crackers and breadsticks; low sodium or homemade bread crumbs Bread, rolls and crackers with salted tops; quick breads; instant hot cereals; pancakes; commercial bread  stuffing; self-rising flower and biscuit mixes; regular bread crumbs or cracker crumbs  Desserts and Sweets Desserts and sweets mad with mild should be within allowance Instant pudding mixes and cake mixes  Fats Butter or margarine; vegetable oils; unsalted salad dressings, regular salad dressings limited to 1 Tbs; light, sour and heavy cream Regular salad dressings containing bacon fat, bacon bits, and salt pork; snack dips made with instant soup mixes or processed cheese; salted nuts  Fruits Most fresh, frozen and canned fruits Fruits processed with salt or sodium-containing ingredient (some dried fruits are processed with sodium sulfites        Vegetables Fresh, frozen vegetables and low- sodium canned vegetables Regular canned vegetables, sauerkraut, pickled vegetables, and others prepared in brine; frozen vegetables in sauces; vegetables seasoned with ham, bacon or salt pork  Condiments, Sauces, Miscellaneous  Salt substitute with physician's approval; pepper, herbs, spices; vinegar, lemon or lime juice; hot pepper sauce; garlic powder, onion powder, low sodium soy sauce (1 Tbs.); low sodium condiments (ketchup, chili sauce, mustard) in limited amounts (1 tsp.) fresh ground horseradish; unsalted tortilla chips, pretzels, potato chips, popcorn, salsa (1/4 cup) Any seasoning made with salt including garlic salt, celery salt, onion salt, and seasoned salt; sea salt, rock salt, kosher salt; meat tenderizers; monosodium glutamate; mustard, regular soy sauce, barbecue, sauce, chili sauce, teriyaki sauce, steak sauce, Worcestershire sauce, and most flavored vinegars; canned gravy and mixes; regular condiments; salted snack foods, olives, picles, relish, horseradish sauce, catsup   Food preparation: Try these seasonings Meats:    Pork Sage, onion Serve with applesauce  Chicken Poultry seasoning, thyme, parsley Serve with cranberry sauce  Lamb Curry powder, rosemary, garlic, thyme Serve with mint sauce  or jelly  Veal Marjoram, basil Serve with current jelly, cranberry sauce  Beef Pepper, bay leaf Serve with dry mustard, unsalted chive butter  Fish [place] leaf, dill Serve with unsalted lemon butter, unsalted parsley butter  Vegetables:    Asparagus Lemon juice   Broccoli Lemon juice   Carrots Mustard dressing parsley, mint, nutmeg, glazed with unsalted butter and sugar   Green beans Marjoram, lemon juice, nutmeg,dill seed   Tomatoes Basil, marjoram, onion   Spice /blend for [employer]" 4 tsp ground thyme 1 tsp ground sage 3 tsp ground rosemary 4 tsp ground marjoram   Test your knowledge A product that says "Salt Free" may still contain sodium. True or False Garlic Powder and Hot Pepper Sauce an be used as alternative seasonings.True or False Processed foods have more sodium than fresh foods.  True or False Canned Vegetables have less sodium than froze True or False   WAYS TO DECREASE YOUR SODIUM INTAKE Avoid the use of added salt in cooking and at the table.  Table salt (and other prepared seasonings which contain salt) is probably one of the greatest sources of sodium in the diet.  Unsalted foods can gain flavor from the sweet, sour, and butter taste sensations of herbs and spices.  Instead of using salt for seasoning, try the following seasonings with the foods listed.  Remember: how you use them to enhance natural food flavors is limited only  by your creativity... Allspice-Meat, fish, eggs, fruit, peas, red and yellow vegetables Almond Extract-Fruit baked goods Anise Seed-Sweet breads, fruit, carrots, beets, cottage cheese, cookies (tastes like licorice) Basil-Meat, fish, eggs, vegetables, rice, vegetables salads, soups, sauces Bay Leaf-Meat, fish, stews, poultry Burnet-Salad, vegetables (cucumber-like flavor) Caraway Seed-Bread, cookies, cottage cheese, meat, vegetables, cheese, rice Cardamon-Baked goods, fruit, soups Celery Powder or seed-Salads, salad dressings, sauces, meatloaf,  soup, bread.Do not use  celery salt Chervil-Meats, salads, fish, eggs, vegetables, cottage cheese (parsley-like flavor) Chili Power-Meatloaf, chicken cheese, corn, eggplant, egg dishes Chives-Salads cottage cheese, egg dishes, soups, vegetables, sauces Cilantro-Salsa, casseroles Cinnamon-Baked goods, fruit, pork, lamb, chicken, carrots Cloves-Fruit, baked goods, fish, pot roast, green beans, beets, carrots Coriander-Pastry, cookies, meat, salads, cheese (lemon-orange flavor) Cumin-Meatloaf, fish,cheese, eggs, cabbage,fruit pie (caraway flavor) [employer], fruit, eggs, fish, poultry, cottage cheese, vegetables Dill Seed-Meat, cottage cheese, poultry, vegetables, fish, salads, bread Fennel Seed-Bread, cookies, apples, pork, eggs, fish, beets, cabbage, cheese, Licorice-like flavor Garlic-(buds or powder) Salads, meat, poultry, fish, bread, butter, vegetables, potatoes.Do not  use garlic salt Ginger-Fruit, vegetables, baked goods, meat, fish, poultry Horseradish Root-Meet, vegetables, butter Lemon Juice or Extract-Vegetables, fruit, tea, baked goods, fish salads Mace-Baked goods fruit, vegetables, fish, poultry (taste like nutmeg) Maple Extract-Syrups Marjoram-Meat, chicken, fish, vegetables, breads, green salads (taste like Sage) Mint-Tea, lamb, sherbet, vegetables, desserts, carrots, cabbage Mustard, Dry or Seed-Cheese, eggs, meats, vegetables, poultry Nutmeg-Baked goods, fruit, chicken, eggs, vegetables, desserts Onion Powder-Meat, fish, poultry, vegetables, cheese, eggs, bread, rice salads (Do not use   Onion salt) Orange Extract-Desserts, baked goods Oregano-Pasta, eggs, cheese, onions, pork, lamb, fish, chicken, vegetables, green salads Paprika-Meat, fish, poultry, eggs, cheese, vegetables Parsley Flakes-Butter, vegetables, meat fish, poultry, eggs, bread, salads (certain forms may   Contain sodium Pepper-Meat fish, poultry, vegetables, eggs Peppermint Extract-Desserts, baked  goods Poppy Seed-Eggs, bread, cheese, fruit dressings, baked goods, noodles, vegetables, cottage  [employer], poultry, meat, fish, cauliflower, turnips,eggs bread Saffron-Rice, bread, veal, chicken, fish, eggs Sage-Meat, fish, poultry, onions, eggplant, tomateos, pork, stews Savory-Eggs, salads, poultry, meat, rice, vegetables, soups, pork Tarragon-Meat, poultry, fish, eggs, butter, vegetables (licorice-like flavor)  Thyme-Meat, poultry, fish, eggs, vegetables, (clover-like flavor), sauces, soups Tumeric-Salads, butter, eggs, fish, rice, vegetables (saffron-like flavor) Vanilla Extract-Baked goods, candy Vinegar-Salads, vegetables, meat marinades Walnut Extract-baked goods, candy   2. Choose your Foods Wisely   The following is a list of foods to avoid which are high in sodium:  Meats-Avoid all smoked, canned, salt cured, dried and kosher meat and fish as well as Anchovies   [employer] meats:Bologna, Liverwurst, Pastrami Canned meat or fish  Marinated herring Caviar    Pepperoni Corned Beef   Pizza Dried chipped beef  Salami Frozen breaded fish or meat Salt pork Frankfurters or hot dogs  Sardines Gefilte fish   Sausage Ham (boiled ham, Proscuitto Smoked butt    spiced ham)   Spam      TV Dinners Vegetables Canned vegetables (Regular) Relish Canned mushrooms  Sauerkraut Olives    Tomato juice Pickles  Bakery and Dessert Products Canned puddings  Cream pies Cheesecake   Decorated cakes Cookies  Beverages/Juices Tomato juice, regular  Gatorade   V-8 vegetable juice, regular  Breads and Cereals Biscuit mixes   Salted potato chips, corn chips, pretzels Bread stuffing mixes  Salted crackers and rolls Pancake and waffle mixes Self-rising flour  Seasonings Accent    Meat sauces Barbecue sauce  Meat tenderizer Catsup    Monosodium glutamate (MSG) Celery salt   Onion salt  Chili sauce   Prepared mustard Garlic  salt   Salt, seasoned salt, sea salt Gravy mixes   Soy sauce Horseradish   Steak sauce Ketchup   Tartar sauce Lite salt    Teriyaki sauce Marinade mixes   Worcestershire sauce  Others Baking powder   Cocoa and cocoa mixes Baking soda   Commercial casserole mixes Candy-caramels, chocolate  Dehydrated soups    Bars, fudge,nougats  Instant rice and pasta mixes Canned broth or soup  Maraschino cherries Cheese, aged and processed cheese and cheese spreads  Learning Assessment Quiz  Indicated T (for True) or F (for False) for each of the following statements:  _____ Fresh fruits and vegetables and unprocessed grains are generally low in sodium _____ Water may contain a considerable amount of sodium, depending on the source _____ You can always tell if a food is high in sodium by tasting it _____ Certain laxatives my be high in sodium and should be avoided unless prescribed   by a physician or pharmacist _____ Salt substitutes may be used freely by anyone on a sodium restricted diet _____ Sodium is present in table salt, food additives and as a natural component of   most foods _____ Table salt is approximately 90% sodium _____ Limiting sodium intake may help prevent excess fluid accumulation in the body _____ On a sodium-restricted diet, seasonings such as bouillon soy sauce, and    cooking wine should be used in place of table salt _____ On an ingredient list, a product which lists monosodium glutamate as the first   ingredient is an appropriate food to include on a low sodium diet  Circle the best answer(s) to the following statements (Hint: there may be more than one correct answer)  11. On a low-sodium diet, some acceptable snack items are:    A. Olives  F. Bean dip   K. Grapefruit juice    B. Salted Pretzels G. Commercial Popcorn   L. Canned peaches    C. Carrot Sticks  H. Bouillon   M. Unsalted nuts   D. [place] fries  I. Peanut butter crackers N. Salami   E. Sweet pickles J.  Tomato Juice   O. Pizza  12.  Seasonings that may be used freely on a reduced - sodium diet include   A. Lemon wedges F.Monosodium glutamate K. Celery seed    B.Soysauce   G. Pepper   L. Mustard powder   C. Sea salt  H. Cooking wine  M. Onion flakes   D. Vinegar  E. Prepared horseradish N. Salsa   E. Sage   J. Worcestershire sauce  O. Chutney

## 2021-04-27 DIAGNOSIS — M0589 Other rheumatoid arthritis with rheumatoid factor of multiple sites: Secondary | ICD-10-CM | POA: Diagnosis not present

## 2021-04-27 DIAGNOSIS — Z79899 Other long term (current) drug therapy: Secondary | ICD-10-CM | POA: Diagnosis not present

## 2021-06-17 ENCOUNTER — Other Ambulatory Visit: Payer: Self-pay | Admitting: Physician Assistant

## 2021-06-23 DIAGNOSIS — M0589 Other rheumatoid arthritis with rheumatoid factor of multiple sites: Secondary | ICD-10-CM | POA: Diagnosis not present

## 2021-06-23 DIAGNOSIS — Z79899 Other long term (current) drug therapy: Secondary | ICD-10-CM | POA: Diagnosis not present

## 2021-08-03 ENCOUNTER — Other Ambulatory Visit: Payer: Self-pay | Admitting: Physician Assistant

## 2021-08-11 DIAGNOSIS — M1991 Primary osteoarthritis, unspecified site: Secondary | ICD-10-CM | POA: Diagnosis not present

## 2021-08-11 DIAGNOSIS — Z79899 Other long term (current) drug therapy: Secondary | ICD-10-CM | POA: Diagnosis not present

## 2021-08-11 DIAGNOSIS — M0589 Other rheumatoid arthritis with rheumatoid factor of multiple sites: Secondary | ICD-10-CM | POA: Diagnosis not present

## 2021-08-13 ENCOUNTER — Other Ambulatory Visit: Payer: Self-pay | Admitting: Cardiovascular Disease

## 2021-08-18 DIAGNOSIS — Z79899 Other long term (current) drug therapy: Secondary | ICD-10-CM | POA: Diagnosis not present

## 2021-08-18 DIAGNOSIS — M0589 Other rheumatoid arthritis with rheumatoid factor of multiple sites: Secondary | ICD-10-CM | POA: Diagnosis not present

## 2021-10-11 DIAGNOSIS — I10 Essential (primary) hypertension: Secondary | ICD-10-CM | POA: Diagnosis not present

## 2021-10-11 DIAGNOSIS — D126 Benign neoplasm of colon, unspecified: Secondary | ICD-10-CM | POA: Diagnosis not present

## 2021-10-13 DIAGNOSIS — Z79899 Other long term (current) drug therapy: Secondary | ICD-10-CM | POA: Diagnosis not present

## 2021-10-13 DIAGNOSIS — R5383 Other fatigue: Secondary | ICD-10-CM | POA: Diagnosis not present

## 2021-10-13 DIAGNOSIS — M0589 Other rheumatoid arthritis with rheumatoid factor of multiple sites: Secondary | ICD-10-CM | POA: Diagnosis not present

## 2021-12-12 DIAGNOSIS — Z79899 Other long term (current) drug therapy: Secondary | ICD-10-CM | POA: Diagnosis not present

## 2021-12-12 DIAGNOSIS — M0589 Other rheumatoid arthritis with rheumatoid factor of multiple sites: Secondary | ICD-10-CM | POA: Diagnosis not present

## 2022-01-10 DIAGNOSIS — D696 Thrombocytopenia, unspecified: Secondary | ICD-10-CM | POA: Diagnosis not present

## 2022-02-06 DIAGNOSIS — M0589 Other rheumatoid arthritis with rheumatoid factor of multiple sites: Secondary | ICD-10-CM | POA: Diagnosis not present

## 2022-02-06 DIAGNOSIS — D696 Thrombocytopenia, unspecified: Secondary | ICD-10-CM | POA: Diagnosis not present

## 2022-02-06 DIAGNOSIS — Z79899 Other long term (current) drug therapy: Secondary | ICD-10-CM | POA: Diagnosis not present

## 2022-02-13 DIAGNOSIS — M0589 Other rheumatoid arthritis with rheumatoid factor of multiple sites: Secondary | ICD-10-CM | POA: Diagnosis not present

## 2022-02-13 DIAGNOSIS — Z79899 Other long term (current) drug therapy: Secondary | ICD-10-CM | POA: Diagnosis not present

## 2022-02-13 DIAGNOSIS — D696 Thrombocytopenia, unspecified: Secondary | ICD-10-CM | POA: Diagnosis not present

## 2022-02-13 DIAGNOSIS — M1991 Primary osteoarthritis, unspecified site: Secondary | ICD-10-CM | POA: Diagnosis not present

## 2022-03-07 DIAGNOSIS — K648 Other hemorrhoids: Secondary | ICD-10-CM | POA: Diagnosis not present

## 2022-03-07 DIAGNOSIS — Z86010 Personal history of colonic polyps: Secondary | ICD-10-CM | POA: Diagnosis not present

## 2022-03-07 DIAGNOSIS — K5730 Diverticulosis of large intestine without perforation or abscess without bleeding: Secondary | ICD-10-CM | POA: Diagnosis not present

## 2022-03-07 DIAGNOSIS — D125 Benign neoplasm of sigmoid colon: Secondary | ICD-10-CM | POA: Diagnosis not present

## 2022-03-07 DIAGNOSIS — K6389 Other specified diseases of intestine: Secondary | ICD-10-CM | POA: Diagnosis not present

## 2022-03-07 DIAGNOSIS — D124 Benign neoplasm of descending colon: Secondary | ICD-10-CM | POA: Diagnosis not present

## 2022-03-07 DIAGNOSIS — Z09 Encounter for follow-up examination after completed treatment for conditions other than malignant neoplasm: Secondary | ICD-10-CM | POA: Diagnosis not present

## 2022-04-04 DIAGNOSIS — Z79899 Other long term (current) drug therapy: Secondary | ICD-10-CM | POA: Diagnosis not present

## 2022-04-04 DIAGNOSIS — M0589 Other rheumatoid arthritis with rheumatoid factor of multiple sites: Secondary | ICD-10-CM | POA: Diagnosis not present

## 2022-05-04 ENCOUNTER — Other Ambulatory Visit: Payer: Self-pay | Admitting: Physician Assistant

## 2022-05-04 MED ORDER — AMLODIPINE BESYLATE 5 MG PO TABS: 5.0000 mg | ORAL_TABLET | Freq: Every day | ORAL | 0 refills | Status: DC

## 2022-05-04 NOTE — Addendum Note (Signed)
Addended by: Michelle Nasuti on: 05/04/2022 10:03 AM   Modules accepted: Orders

## 2022-05-18 ENCOUNTER — Other Ambulatory Visit: Payer: Self-pay | Admitting: Cardiovascular Disease

## 2022-05-23 NOTE — Progress Notes (Unsigned)
Office Visit    Patient Name: Marcus Peterson Date of Encounter: 05/24/2022  PCP:  Lajean Manes, [facility]  Cardiologist:  Sherren Mocha, MD  Advanced Practice Provider:  Liliane Shi, PA-C Electrophysiologist:  None   HPI    Marcus Peterson is a 72 y.o. male past medical history significant for coronary artery disease (status post lateral STEMI 11/2018 treatment with DES to LCx/OM), normal echo 11/2018, HTN, Rheumatoid arthritis, hyperlipidemia, and GERD presents today for annual follow-up appointment.  Marcus Peterson was most recently seen April 2022 by Richardson Dopp, PA-C. At that time he continues to note symptoms of discomfort in his chest especially after eating. He was taking nitro for it. His symptoms with his MI felt like indigestion. He denied SOB, orthopnea, syncope. He did have some lightheadedness when he stands up quickly and some dependent pedal edema.   Today, he tells me that he is still experiencing some chest comfort from time to time mostly after eating.  Nitroglycerin does seem to help.  He had a stress test back in 2022 which we reviewed again.  He asked me about the need for antibiotics for dental cleanings and since he does not have a valve replacement, there is no need.  We have asked him to check his heart rate and blood pressure at home.  His heart rate is in the 50s today but his EKG shows a heart rate of 48 bpm.  He is asymptomatic without any fatigue, shortness of breath, dizziness, lightheadedness.  His legs sometimes are swollen but when he elevates them it goes away.  We have sent in refills of all his medications.  Reports no shortness of breath nor dyspnea on exertion.  No orthopnea, PND. Reports no palpitations.   Past Medical History    Past Medical History:  Diagnosis Date   Arthritis    CAD in native artery    a. cath 11/2018 - Severe left circumflex/obtuse marginal stenosis s/p DES to both. 50% LAD>> medical  therapy    Cancer ([place])    melanoma on right arm    Central serous choroidopathy    Coronary artery disease    Erectile dysfunction    Situation reaction   GERD (gastroesophageal reflux disease)    Hyperlipidemia    Hypertension    MI (myocardial infarction) ([place]) 12/03/2018   Nonspecific abnormal unspecified cardiovascular function study    Rheumatic arteritis    Right wrist fracture    Shingles 2010   Past Surgical History:  Procedure Laterality Date   adenomotous colon polyp N/A 2001 and 2012   CARDIAC CATHETERIZATION     COLONOSCOPY WITH PROPOFOL N/A 11/22/2015   Procedure: COLONOSCOPY WITH PROPOFOL;  Surgeon: Garlan Fair, MD;  Location: [facility];  Service: Endoscopy;  Laterality: N/A;   CORONARY ANGIOPLASTY     CORONARY/GRAFT ACUTE MI REVASCULARIZATION N/A 12/03/2018   Procedure: CORONARY/GRAFT ACUTE MI REVASCULARIZATION;  Surgeon: Sherren Mocha, MD;  Location: [facility];  Service: Cardiovascular;  Laterality: N/A;   HERNIA REPAIR  01/2011   RIH   JOINT REPLACEMENT     left knee   LEFT HEART CATH AND CORONARY ANGIOGRAPHY N/A 12/03/2018   Procedure: LEFT HEART CATH AND CORONARY ANGIOGRAPHY;  Surgeon: Sherren Mocha, MD;  Location: [facility];  Service: Cardiovascular;  Laterality: N/A;   LEFT HEART CATHETERIZATION WITH CORONARY ANGIOGRAM N/A 11/05/2012   Procedure: LEFT HEART CATHETERIZATION WITH CORONARY ANGIOGRAM;  Surgeon:  Jettie Booze, MD;  Location: [facility] C[facility] LAB;  Service: Cardiovascular;  Laterality: N/A;   left knee replacement Left    contracture, L knee replacement   s/p arthroscopic surgery right elbow Right    SPINE SURGERY     lumbar fusion    Allergies  No Known Allergies   EKGs/Labs/Other Studies Reviewed:   The following studies were reviewed today:  Echocardiogram 12/04/2018 EF 60-65, Gr 1 DD, no RWMA   Cardiac catheterization 12/03/2018 LM irregs LAD prox 50, mid 50 LCx mid 70; OM2 95 RCA mild dz  EF 55-65 PCI:  2.5 x 24 Synergy DES to LCx/OM2   EKG:  EKG is  ordered today.  The ekg ordered today demonstrates NSR, rate 48 bpm  Recent Labs: No results found for requested labs within last 365 days.  Recent Lipid Panel    Component Value Date/Time   CHOL 103 01/26/2019 0942   TRIG 158 (H) 01/26/2019 0942   HDL 30 (L) 01/26/2019 0942   CHOLHDL 3.4 01/26/2019 0942   CHOLHDL 5.7 12/04/2018 0216   VLDL 53 (H) 12/04/2018 0216   LDLCALC 46 01/26/2019 0942     Home Medications   Current Meds  Medication Sig   amLODipine (NORVASC) 5 MG tablet Take 1 tablet (5 mg total) by mouth daily. Please keep scheduled appointment for future refills. Thank you.   aspirin EC 81 MG EC tablet Take 1 tablet (81 mg total) by mouth daily.   atorvastatin (LIPITOR) 80 MG tablet TAKE 1 TABLET(80 MG) BY MOUTH DAILY AT 6 PM   benazepril (LOTENSIN) 40 MG tablet TAKE 1 TABLET BY MOUTH DAILY   Calcium Carbonate-Vitamin D (CALCIUM + D PO) Take 1 tablet by mouth daily.   folic acid (FOLVITE) 1 MG tablet Take 1 mg by mouth daily.   inFLIXimab-abda (RENFLEXIS IV) Inject into the vein. Takes injection every 8 weeks for RA   methotrexate 2.5 MG tablet Take 17.5 mg by mouth once a week. On Fridays   metoprolol succinate (TOPROL-XL) 50 MG 24 hr tablet Take 50 mg by mouth daily. Take with or immediately following a meal.   Multiple Vitamin (MULTIVITAMIN) tablet Take 1 tablet by mouth daily.   nitroGLYCERIN (NITROSTAT) 0.4 MG SL tablet Place 1 tablet (0.4 mg total) under the tongue every 5 (five) minutes as needed for chest pain.   sildenafil (REVATIO) 20 MG tablet Take 20 mg by mouth as needed (erectile dysfuction).     Review of Systems      All other systems reviewed and are otherwise negative except as noted above.  Physical Exam    VS:  BP (!) 144/60   Pulse (!) 55   Ht '5\' 11"'$  (1.803 m)   Wt 244 lb 9.6 oz (110.9 kg)   SpO2 95%   BMI 34.11 kg/m  , BMI Body mass index is 34.11 kg/m.  Wt Readings from Last 3  Encounters:  05/24/22 244 lb 9.6 oz (110.9 kg)  04/25/21 248 lb (112.5 kg)  08/03/20 244 lb (110.7 kg)     GEN: Well nourished, well developed, in no acute distress. HEENT: normal. Neck: Supple, no JVD, carotid bruits, or masses. Cardiac: RRR, no murmurs, rubs, or gallops. No clubbing, cyanosis, edema.  Radials/PT 2+ and equal bilaterally.  Respiratory:  Respirations regular and unlabored, clear to auscultation bilaterally. GI: Soft, nontender, nondistended. MS: No deformity or atrophy. Skin: Warm and dry, no rash. Neuro:  Strength and sensation are intact. Psych: Normal affect.  Assessment &  Plan    CAD/precordial pain -normal stress test 07/2020 -Still having some occasional chest pain but mostly occurs after eating. -will defer ischemic workup for now -continue nitro as needed  HTN -slightly elevated today -Would recommend continuing current medication regimen including amlodipine 5 mg daily, aspirin 81 mg daily, Lipitor 80 mg daily, benazepril 40 mg daily metoprolol succinate 50 mg daily, nitro as needed -continue to monitor BP at home  HLD -would recommend updated lipid panel when he sees his PCP -last lipid panel with LDL 46  Chronic GERD -continue tums as needed     Disposition: Follow up 1 year with Sherren Mocha, MD or APP.  Signed, Elgie Collard, PA-C 05/24/2022, 8:08 AM [facility]

## 2022-05-24 ENCOUNTER — Ambulatory Visit: Payer: Medicare Other | Attending: Physician Assistant | Admitting: Physician Assistant

## 2022-05-24 ENCOUNTER — Encounter: Payer: Self-pay | Admitting: Physician Assistant

## 2022-05-24 VITALS — BP 144/60 | HR 55 | Ht 71.0 in | Wt 244.6 lb

## 2022-05-24 DIAGNOSIS — I2510 Atherosclerotic heart disease of native coronary artery without angina pectoris: Secondary | ICD-10-CM

## 2022-05-24 DIAGNOSIS — R072 Precordial pain: Secondary | ICD-10-CM | POA: Diagnosis not present

## 2022-05-24 DIAGNOSIS — E785 Hyperlipidemia, unspecified: Secondary | ICD-10-CM

## 2022-05-24 DIAGNOSIS — K219 Gastro-esophageal reflux disease without esophagitis: Secondary | ICD-10-CM

## 2022-05-24 DIAGNOSIS — I10 Essential (primary) hypertension: Secondary | ICD-10-CM

## 2022-05-24 MED ORDER — BENAZEPRIL HCL 40 MG PO TABS
40.0000 mg | ORAL_TABLET | Freq: Every day | ORAL | 2 refills | Status: DC
Start: 2022-05-24 — End: 2023-01-08

## 2022-05-24 MED ORDER — AMLODIPINE BESYLATE 5 MG PO TABS: 5.0000 mg | ORAL_TABLET | Freq: Every day | ORAL | 3 refills | Status: DC

## 2022-05-24 MED ORDER — ATORVASTATIN CALCIUM 80 MG PO TABS
ORAL_TABLET | ORAL | 3 refills | Status: DC
Start: 2022-05-24 — End: 2022-06-12

## 2022-05-24 NOTE — Patient Instructions (Signed)
Medication Instructions:  Your physician recommends that you continue on your current medications as directed. Please refer to the Current Medication list given to you today.  *If you need a refill on your cardiac medications before your next appointment, please call your pharmacy*   Lab Work: None ordered If you have labs (blood work) drawn today and your tests are completely normal, you will receive your results only by: [place] (if you have MyChart) OR A paper copy in the mail If you have any lab test that is abnormal or we need to change your treatment, we will call you to review the results.   Follow-Up: At [facility], you and your health needs are our priority.  As part of our continuing mission to provide you with exceptional heart care, we have created designated Provider Care Teams.  These Care Teams include your primary Cardiologist (physician) and Advanced Practice Providers (APPs -  Physician Assistants and Nurse Practitioners) who all work together to provide you with the care you need, when you need it.  We recommend signing up for the patient portal called "MyChart".  Sign up information is provided on this After Visit Summary.  MyChart is used to connect with patients for Virtual Visits (Telemedicine).  Patients are able to view lab/test results, encounter notes, upcoming appointments, etc.  Non-urgent messages can be sent to your provider as well.   To learn more about what you can do with MyChart, go to [email or web address].    Your next appointment:   1 year(s)  Provider:   Sherren Mocha, MD    Other Instructions Keep track of your blood pressure and heart rate at home and call and let us know if your heart rate remains in the 40's or if you have lightheadedness or dizziness.

## 2022-05-31 DIAGNOSIS — M0589 Other rheumatoid arthritis with rheumatoid factor of multiple sites: Secondary | ICD-10-CM | POA: Diagnosis not present

## 2022-05-31 DIAGNOSIS — Z79899 Other long term (current) drug therapy: Secondary | ICD-10-CM | POA: Diagnosis not present

## 2022-05-31 DIAGNOSIS — R5383 Other fatigue: Secondary | ICD-10-CM | POA: Diagnosis not present

## 2022-06-07 DIAGNOSIS — I10 Essential (primary) hypertension: Secondary | ICD-10-CM | POA: Diagnosis not present

## 2022-06-07 DIAGNOSIS — Z79631 Long term (current) use of antimetabolite agent: Secondary | ICD-10-CM | POA: Diagnosis not present

## 2022-06-07 DIAGNOSIS — Z79899 Other long term (current) drug therapy: Secondary | ICD-10-CM | POA: Diagnosis not present

## 2022-06-07 DIAGNOSIS — D84821 Immunodeficiency due to drugs: Secondary | ICD-10-CM | POA: Diagnosis not present

## 2022-06-07 DIAGNOSIS — I209 Angina pectoris, unspecified: Secondary | ICD-10-CM | POA: Diagnosis not present

## 2022-06-07 DIAGNOSIS — M069 Rheumatoid arthritis, unspecified: Secondary | ICD-10-CM | POA: Diagnosis not present

## 2022-06-07 DIAGNOSIS — Z0000 Encounter for general adult medical examination without abnormal findings: Secondary | ICD-10-CM | POA: Diagnosis not present

## 2022-06-07 DIAGNOSIS — E7800 Pure hypercholesterolemia, unspecified: Secondary | ICD-10-CM | POA: Diagnosis not present

## 2022-06-12 ENCOUNTER — Other Ambulatory Visit: Payer: Self-pay | Admitting: Cardiovascular Disease

## 2022-07-26 DIAGNOSIS — R5383 Other fatigue: Secondary | ICD-10-CM | POA: Diagnosis not present

## 2022-07-26 DIAGNOSIS — Z79899 Other long term (current) drug therapy: Secondary | ICD-10-CM | POA: Diagnosis not present

## 2022-07-26 DIAGNOSIS — M0589 Other rheumatoid arthritis with rheumatoid factor of multiple sites: Secondary | ICD-10-CM | POA: Diagnosis not present

## 2022-08-02 DIAGNOSIS — M0589 Other rheumatoid arthritis with rheumatoid factor of multiple sites: Secondary | ICD-10-CM | POA: Diagnosis not present

## 2022-08-02 DIAGNOSIS — M1991 Primary osteoarthritis, unspecified site: Secondary | ICD-10-CM | POA: Diagnosis not present

## 2022-08-02 DIAGNOSIS — Z79899 Other long term (current) drug therapy: Secondary | ICD-10-CM | POA: Diagnosis not present

## 2022-08-20 ENCOUNTER — Other Ambulatory Visit: Payer: Self-pay | Admitting: Physician Assistant

## 2022-09-20 DIAGNOSIS — M0589 Other rheumatoid arthritis with rheumatoid factor of multiple sites: Secondary | ICD-10-CM | POA: Diagnosis not present

## 2022-09-20 DIAGNOSIS — R5383 Other fatigue: Secondary | ICD-10-CM | POA: Diagnosis not present

## 2022-09-20 DIAGNOSIS — Z79899 Other long term (current) drug therapy: Secondary | ICD-10-CM | POA: Diagnosis not present

## 2022-11-15 DIAGNOSIS — Z79899 Other long term (current) drug therapy: Secondary | ICD-10-CM | POA: Diagnosis not present

## 2022-11-15 DIAGNOSIS — M0589 Other rheumatoid arthritis with rheumatoid factor of multiple sites: Secondary | ICD-10-CM | POA: Diagnosis not present

## 2022-11-15 DIAGNOSIS — R5383 Other fatigue: Secondary | ICD-10-CM | POA: Diagnosis not present

## 2022-12-05 DIAGNOSIS — D84821 Immunodeficiency due to drugs: Secondary | ICD-10-CM | POA: Diagnosis not present

## 2022-12-05 DIAGNOSIS — Z79631 Long term (current) use of antimetabolite agent: Secondary | ICD-10-CM | POA: Diagnosis not present

## 2022-12-05 DIAGNOSIS — R739 Hyperglycemia, unspecified: Secondary | ICD-10-CM | POA: Diagnosis not present

## 2022-12-05 DIAGNOSIS — M069 Rheumatoid arthritis, unspecified: Secondary | ICD-10-CM | POA: Diagnosis not present

## 2022-12-05 DIAGNOSIS — I10 Essential (primary) hypertension: Secondary | ICD-10-CM | POA: Diagnosis not present

## 2022-12-05 DIAGNOSIS — I209 Angina pectoris, unspecified: Secondary | ICD-10-CM | POA: Diagnosis not present

## 2023-01-03 ENCOUNTER — Other Ambulatory Visit: Payer: Self-pay | Admitting: Physician Assistant

## 2023-01-08 ENCOUNTER — Other Ambulatory Visit: Payer: Self-pay | Admitting: Physician Assistant

## 2023-01-10 DIAGNOSIS — R5383 Other fatigue: Secondary | ICD-10-CM | POA: Diagnosis not present

## 2023-01-10 DIAGNOSIS — Z79899 Other long term (current) drug therapy: Secondary | ICD-10-CM | POA: Diagnosis not present

## 2023-01-10 DIAGNOSIS — M0589 Other rheumatoid arthritis with rheumatoid factor of multiple sites: Secondary | ICD-10-CM | POA: Diagnosis not present

## 2023-02-07 DIAGNOSIS — M0589 Other rheumatoid arthritis with rheumatoid factor of multiple sites: Secondary | ICD-10-CM | POA: Diagnosis not present

## 2023-02-07 DIAGNOSIS — D696 Thrombocytopenia, unspecified: Secondary | ICD-10-CM | POA: Diagnosis not present

## 2023-02-07 DIAGNOSIS — Z79899 Other long term (current) drug therapy: Secondary | ICD-10-CM | POA: Diagnosis not present

## 2023-02-07 DIAGNOSIS — M1991 Primary osteoarthritis, unspecified site: Secondary | ICD-10-CM | POA: Diagnosis not present

## 2023-03-07 DIAGNOSIS — M0589 Other rheumatoid arthritis with rheumatoid factor of multiple sites: Secondary | ICD-10-CM | POA: Diagnosis not present

## 2023-04-08 DIAGNOSIS — D696 Thrombocytopenia, unspecified: Secondary | ICD-10-CM | POA: Diagnosis not present

## 2023-05-02 DIAGNOSIS — M0589 Other rheumatoid arthritis with rheumatoid factor of multiple sites: Secondary | ICD-10-CM | POA: Diagnosis not present

## 2023-05-17 ENCOUNTER — Encounter: Payer: Self-pay | Admitting: *Deleted

## 2023-05-20 ENCOUNTER — Ambulatory Visit: Payer: Medicare Other | Attending: Cardiovascular Disease | Admitting: Cardiovascular Disease

## 2023-05-20 ENCOUNTER — Encounter: Payer: Self-pay | Admitting: Cardiovascular Disease

## 2023-05-20 VITALS — BP 150/80 | HR 48 | Ht 70.0 in | Wt 247.8 lb

## 2023-05-20 DIAGNOSIS — E782 Mixed hyperlipidemia: Secondary | ICD-10-CM | POA: Diagnosis not present

## 2023-05-20 DIAGNOSIS — I2510 Atherosclerotic heart disease of native coronary artery without angina pectoris: Secondary | ICD-10-CM | POA: Diagnosis not present

## 2023-05-20 DIAGNOSIS — I10 Essential (primary) hypertension: Secondary | ICD-10-CM

## 2023-05-20 MED ORDER — AMLODIPINE BESYLATE 5 MG PO TABS: 5.0000 mg | ORAL_TABLET | Freq: Every day | ORAL | 3 refills | Status: AC

## 2023-05-20 MED ORDER — BENAZEPRIL HCL 40 MG PO TABS: 40.0000 mg | ORAL_TABLET | Freq: Every day | ORAL | 3 refills | Status: DC

## 2023-05-20 MED ORDER — ATORVASTATIN CALCIUM 80 MG PO TABS: ORAL_TABLET | ORAL | 3 refills | Status: AC

## 2023-05-20 MED ORDER — METOPROLOL SUCCINATE ER 50 MG PO TB24: 50.0000 mg | ORAL_TABLET | Freq: Every day | ORAL | 3 refills | Status: AC

## 2023-05-20 NOTE — Progress Notes (Signed)
Cardiology Office Note:    Date:  05/20/2023   ID:  KYSHAUN BARNETTE, DOB [birth year], MRN [id number]  PCP:  Merlene Laughter, MD (Inactive)   [facility] Providers Cardiologist:  Tonny Bollman, MD Cardiology APP:  Kennon Rounds     Referring MD: No ref. provider found   Chief Complaint  Patient presents with   Coronary Artery Disease    History of Present Illness:    Marcus Peterson is a 73 y.o. male with a hx of:  Coronary artery disease  S/p Lat STEMI 8.2020 tx with DES to LCx/OM Echocardiogram 11/2018: EF 60-65 Hypertension  Rheumatoid arthritis  Mixed hyperlipidemia  GERD  The patient is here alone today. He's doing well from a cardiac perspective.  He has some limitations from arthritis and reports that he receives disease modifying infusions through his rheumatologist.  He is developed mild thrombocytopenia and they are keeping an eye on that.  He reports that his blood pressure is usually elevated when he first arrives for his infusions but then it decreases after he sits down into the 120s over 70s.  He prefers not to change his blood pressure medicines today. Today, he denies symptoms of palpitations, chest pain, shortness of breath, orthopnea, PND, lower extremity edema, dizziness, or syncope.  Current Medications: Current Meds  Medication Sig   aspirin EC 81 MG EC tablet Take 1 tablet (81 mg total) by mouth daily.   Calcium Carbonate-Vitamin D (CALCIUM + D PO) Take 1 tablet by mouth daily.   folic acid (FOLVITE) 1 MG tablet Take 1 mg by mouth daily.   inFLIXimab-abda (RENFLEXIS IV) Inject into the vein. Takes injection every 8 weeks for RA   loratadine (CLARITIN) 10 MG tablet as needed.   Multiple Vitamin (MULTIVITAMIN) tablet Take 1 tablet by mouth daily.   nitroGLYCERIN (NITROSTAT) 0.4 MG SL tablet Place 1 tablet (0.4 mg total) under the tongue every 5 (five) minutes as needed for chest pain.   sildenafil (REVATIO) 20 MG tablet Take 20 mg by  mouth as needed (erectile dysfuction).   [DISCONTINUED] amLODipine (NORVASC) 5 MG tablet Take 1 tablet (5 mg total) by mouth daily.   [DISCONTINUED] atorvastatin (LIPITOR) 80 MG tablet TAKE 1 TABLET(80 MG) BY MOUTH DAILY AT 6 PM   [DISCONTINUED] benazepril (LOTENSIN) 40 MG tablet TAKE 1 TABLET BY MOUTH DAILY   [DISCONTINUED] methotrexate 2.5 MG tablet Take 17.5 mg by mouth once a week. On Fridays   [DISCONTINUED] metoprolol succinate (TOPROL-XL) 50 MG 24 hr tablet Take 50 mg by mouth daily. Take with or immediately following a meal.     Allergies:   Patient has no known allergies.   ROS:   Please see the history of present illness.    All other systems reviewed and are negative.  EKGs/Labs/Other Studies Reviewed:    The following studies were reviewed today: Cardiac Studies & Procedures   CARDIAC CATHETERIZATION  CARDIAC CATHETERIZATION 12/03/2018  Narrative  A drug-eluting stent was successfully placed using a STENT SYNERGY DES 2.5X24.  Post intervention, there is a 0% residual stenosis.  A drug-eluting stent was successfully placed using a STENT SYNERGY DES 2.5X24.  Post intervention, there is a 0% residual stenosis.  The left ventricular ejection fraction is 55-65% by visual estimate.  LV end diastolic pressure is normal.  The left ventricular systolic function is normal.  1.  Severe left circumflex/obtuse marginal stenosis treated successfully with PCI using a 2.5 x 24 mm Synergy DES 2.  Moderate proximal LAD stenosis estimated at 50% 3.  Widely patent left main and RCA with no significant stenosis 4.  Normal LV systolic function with normal LVEDP, LVEF is estimated at 55 to 60%  Recommendations: If no complications arise, consider fast-track discharge.  Patient with successful PCI and preserved LV function.  Findings Coronary Findings Diagnostic  Dominance: Right  Left Main The vessel exhibits minimal luminal irregularities. The left main is widely patent with no  stenosis.  It divides into the LAD and circumflex.  Left Anterior Descending Prox LAD to Mid LAD lesion is 50% stenosed. The LAD has moderate diffuse proximal vessel stenosis that does not appear highly flow obstructive. Mid LAD lesion is 50% stenosed.  Left Circumflex Mid Cx lesion is 70% stenosed. The lesion is eccentric and ulcerative.  Second Obtuse Marginal Branch 2nd Mrg lesion is 95% stenosed.  Right Coronary Artery There is mild diffuse disease throughout the vessel. The RCA is a large, dominant vessel.  The vessel gives off a PDA and PLA branch.  There is mild diffuse irregularity with no evidence of significant stenosis throughout the RCA or its branch vessels.  Intervention  Mid Cx lesion Stent CATHETER LAUNCHER 6FREBU 3.5 guide catheter was inserted. Lesion crossed with guidewire using a WIRE HI TORQ WHISPER MS 190CM. A drug-eluting stent was successfully placed using a STENT SYNERGY DES 2.5X24. Post-stent angioplasty was performed using a BALLOON SAPPHIRE [place] 3.0X15. Maximum pressure:  16 atm. There is an ulcerated lesion of the mid circumflex and severe stenosis in a second OM branch.  The first OM had to be covered by the stent.  Initially a cougar wire was advanced into the first OM and a whisper wire was advanced into the second OM.  The second OM and mid circumflex lesions are both predilated with a 2.0 mm balloon.  The guidewires became wrapped and both had to be removed from the body.  The lesion was then rewired with a cougar wire and stented with a 2.5 x 24 mm Synergy DES.  The stent was positioned so that it covered the lesion in the second OM branch and extended back across the first OM branch into the mid circumflex providing coverage of that lesion as well.  The stent is deployed at 12 atm and postdilated with a 3.0 mm noncompliant balloon to 16 atm.  Following noncompliant balloon postdilatation, there is no reflow into the first OM.  Intracoronary verapamil was  administered and a single bolus of IV tirofiban is administered.  This restored TIMI-3 flow.  The first OM was not significantly jailed by the stent.  The patient tolerated the procedure well. Post-Intervention Lesion Assessment The intervention was successful. Pre-interventional TIMI flow is 3. Post-intervention TIMI flow is 3. No complications occurred at this lesion. There is a 0% residual stenosis post intervention.  2nd Mrg lesion Stent Lesion crossed with guidewire. Pre-stent angioplasty was performed. A drug-eluting stent was successfully placed using a STENT SYNERGY DES 2.5X24. Post-stent angioplasty was performed using a BALLOON SAPPHIRE [place] 3.0X15. Post-Intervention Lesion Assessment The intervention was successful. Pre-interventional TIMI flow is 3. Post-intervention TIMI flow is 3. No complications occurred at this lesion. There is a 0% residual stenosis post intervention.   STRESS TESTS  MYOCARDIAL PERFUSION IMAGING 08/03/2020  Narrative  Nuclear stress EF: 65%.  The left ventricular ejection fraction is normal (55-65%).  There was no ST segment deviation noted during stress.  The study is normal.  This is a low risk study.  Normal  resting and stress perfusion. No ischemia or infarction EF 65%  ECHOCARDIOGRAM  ECHOCARDIOGRAM COMPLETE 12/04/2018  Narrative ECHOCARDIOGRAM REPORT    Patient Name:   Marcus Peterson Date of Exam: 12/04/2018 Medical Rec #:  [id number]         Height:       71.0 in Accession #:    [id number]        Weight:       250.0 lb Date of Birth:  Nov 11, 1950         BSA:          2.32 m Patient Age:    68 years          BP:           140/80 mmHg Patient Gender: M                 HR:           80 bpm. Exam Location:  Inpatient   Procedure: 2D Echo  Indications:    CAD; Lateral Stemi secondary to occluded circumflex artery.  History:        Patient has no prior history of Echocardiogram examinations. Risk Factors: Hypertension and  Dyslipidemia.  Sonographer:    Thurman Coyer RDCS (AE) Referring Phys: [id number] CHRISTOPHER D MCALHANY  IMPRESSIONS   1. The left ventricle has normal systolic function with an ejection fraction of 60-65%. The cavity size was normal. Left ventricular diastolic Doppler parameters are consistent with impaired relaxation. Normal wall motion. 2. The right ventricle has normal systolic function. The cavity was normal. There is no increase in right ventricular wall thickness. Right ventricular systolic pressure could not be assessed. 3. The aorta is normal unless otherwise noted.  FINDINGS Left Ventricle: The left ventricle has normal systolic function, with an ejection fraction of 60-65%. The cavity size was normal. There is no increase in left ventricular wall thickness. Left ventricular diastolic Doppler parameters are consistent with impaired relaxation. Normal left ventricular filling pressures  Right Ventricle: The right ventricle has normal systolic function. The cavity was normal. There is no increase in right ventricular wall thickness. Right ventricular systolic pressure could not be assessed.  Left Atrium: Left atrial size was normal in size.  Right Atrium: Right atrial size was normal in size.  Interatrial Septum: No atrial level shunt detected by color flow Doppler.  Pericardium: There is no evidence of pericardial effusion.  Mitral Valve: The mitral valve is normal in structure. Mitral valve regurgitation is not visualized by color flow Doppler.  Tricuspid Valve: The tricuspid valve is normal in structure. Tricuspid valve regurgitation was not visualized by color flow Doppler.  Aortic Valve: The aortic valve is normal in structure. Aortic valve regurgitation was not visualized by color flow Doppler.  Pulmonic Valve: The pulmonic valve was normal in structure. Pulmonic valve regurgitation is not visualized by color flow Doppler.  Aorta: The aorta is normal unless otherwise  noted.  Venous: The inferior vena cava was not well visualized.   +--------------+--------++ LEFT VENTRICLE         +----------------+---------++ +--------------+--------++ Diastology                PLAX 2D                +----------------+---------++ +--------------+--------++ LV e' lateral:  7.94 cm/s LVIDd:        3.60 cm  +----------------+---------++ +--------------+--------++ LV E/e' lateral:8.3       LVIDs:  2.45 cm  +----------------+---------++ +--------------+--------++ LV e' medial:   5.98 cm/s LV PW:        1.08 cm  +----------------+---------++ +--------------+--------++ LV E/e' medial: 11.1      LV IVS:       1.07 cm  +----------------+---------++ +--------------+--------++ LVOT diam:    2.40 cm  +--------------+--------++ LV SV:        33 ml    +--------------+--------++ LV SV Index:  13.71    +--------------+--------++ LVOT Area:    4.52 cm +--------------+--------++                        +--------------+--------++  +---------------+----------++ RIGHT VENTRICLE           +---------------+----------++ RV S prime:    18.55 cm/s +---------------+----------++ TAPSE (M-mode):1.3 cm     +---------------+----------++  +---------------+-------++-----------++ LEFT ATRIUM           Index       +---------------+-------++-----------++ LA diam:       4.20 cm1.81 cm/m  +---------------+-------++-----------++ LA Vol (A2C):  39.2 ml16.91 ml/m +---------------+-------++-----------++ LA Vol (A4C):  59.1 ml25.49 ml/m +---------------+-------++-----------++ LA Biplane Vol:50.1 ml21.61 ml/m +---------------+-------++-----------++ +------------+---------++-----------++ RIGHT ATRIUM         Index       +------------+---------++-----------++ RA Area:    12.40 cm            +------------+---------++-----------++ RA Volume:  24.20 ml 10.44  ml/m +------------+---------++-----------++ +------------+-----------++ AORTIC VALVE            +------------+-----------++ LVOT Vmax:  134.00 cm/s +------------+-----------++ LVOT Vmean: 96.400 cm/s +------------+-----------++ LVOT VTI:   0.273 m     +------------+-----------++  +-------------+-------++ AORTA                +-------------+-------++ Ao Root diam:3.00 cm +-------------+-------++  +--------------+----------++ MITRAL VALVE             +--------------+-------+ +--------------+----------++ SHUNTS                MV Area (PHT):2.99 cm   +--------------+-------+ +--------------+----------++ Systemic VTI: 0.27 m  MV PHT:       73.66 msec +--------------+-------+ +--------------+----------++ Systemic Diam:2.40 cm MV Decel Time:254 msec   +--------------+-------+ +--------------+----------++ +--------------+----------++ MV E velocity:66.20 cm/s +--------------+----------++ MV A velocity:70.60 cm/s +--------------+----------++ MV E/A ratio: 0.94       +--------------+----------++   Armanda Magic MD Electronically signed by Armanda Magic MD Signature Date/Time: 12/04/2018/11:46:23 AM    Final             EKG:   EKG Interpretation Date/Time:  Monday May 20 2023 08:06:23 EST Ventricular Rate:  48 PR Interval:  322 QRS Duration:  96 QT Interval:  454 QTC Calculation: 405 R Axis:   63  Text Interpretation: Sinus bradycardia with 1st degree A-V block Septal infarct (cited on or before 15-Jan-2011) T wave abnormality, consider inferior ischemia No significant change since last tracing Confirmed by Tonny Bollman [phone]) on 05/20/2023 8:23:25 AM    Recent Labs: No results found for requested labs within last 365 days.  Recent Lipid Panel    Component Value Date/Time   CHOL 103 01/26/2019 0942   TRIG 158 (H) 01/26/2019 0942   HDL 30 (L) 01/26/2019 0942   CHOLHDL 3.4 01/26/2019 0942    CHOLHDL 5.7 12/04/2018 0216   VLDL 53 (H) 12/04/2018 0216   LDLCALC 46 01/26/2019 0942          Physical Exam:    VS:  BP (!) 150/80  Pulse (!) 48   Ht 5\' 10"  (1.778 m)   Wt 247 lb 12.8 oz (112.4 kg)   SpO2 98%   BMI 35.56 kg/m     Wt Readings from Last 3 Encounters:  05/20/23 247 lb 12.8 oz (112.4 kg)  05/24/22 244 lb 9.6 oz (110.9 kg)  04/25/21 248 lb (112.5 kg)     GEN:  Well nourished, well developed in no acute distress HEENT: Normal NECK: No JVD; No carotid bruits LYMPHATICS: No lymphadenopathy CARDIAC: RRR, no murmurs, rubs, gallops RESPIRATORY:  Clear to auscultation without rales, wheezing or rhonchi  ABDOMEN: Soft, non-tender, non-distended MUSCULOSKELETAL:  No edema; No deformity  SKIN: Warm and dry NEUROLOGIC:  Alert and oriented x 3 PSYCHIATRIC:  Normal affect   Assessment & Plan Coronary artery disease involving native coronary artery of native heart without angina pectoris The patient remains clinically stable with no symptoms of angina on amlodipine and metoprolol for antianginal therapy, aspirin for antiplatelet therapy, and atorvastatin for lipid lowering.  No changes are made today.  I emphasized the importance of regular exercise and dietary modification.  He will return in 1 year for follow-up evaluation. Mixed hyperlipidemia Lipids have been at goal on atorvastatin 80 mg daily.  LDL cholesterol is 50.  Continue current management.  Work on lifestyle modification. Essential hypertension Blood pressure on my repeat check is 152/78.  He states that he has a component of whitecoat hypertension and when he rests for a brief period of time his blood pressure normalizes into the 120s over 70s.  I talked him about increasing amlodipine, but he prefers to work on diet and lifestyle modification and continue his current medicines which include amlodipine, benazepril, and metoprolol succinate.     Medication Adjustments/Labs and Tests Ordered: Current  medicines are reviewed at length with the patient today.  Concerns regarding medicines are outlined above.  Orders Placed This Encounter  Procedures   EKG 12-Lead   Meds ordered this encounter  Medications   atorvastatin (LIPITOR) 80 MG tablet    Sig: TAKE 1 TABLET(80 MG) BY MOUTH DAILY AT 6 PM    Dispense:  90 tablet    Refill:  3   benazepril (LOTENSIN) 40 MG tablet    Sig: Take 1 tablet (40 mg total) by mouth daily.    Dispense:  100 tablet    Refill:  3    Please send a replace/new response with 100-Day Supply if appropriate to maximize member benefit. Requesting 1 year supply.   metoprolol succinate (TOPROL-XL) 50 MG 24 hr tablet    Sig: Take 1 tablet (50 mg total) by mouth daily. Take with or immediately following a meal.    Dispense:  90 tablet    Refill:  3   amLODipine (NORVASC) 5 MG tablet    Sig: Take 1 tablet (5 mg total) by mouth daily.    Dispense:  90 tablet    Refill:  3    Patient Instructions  Follow-Up: At [facility], you and your health needs are our priority.  As part of our continuing mission to provide you with exceptional heart care, we have created designated Provider Care Teams.  These Care Teams include your primary Cardiologist (physician) and Advanced Practice Providers (APPs -  Physician Assistants and Nurse Practitioners) who all work together to provide you with the care you need, when you need it.  We recommend signing up for the patient portal called "MyChart".  Sign up information is provided on this  After Visit Summary.  MyChart is used to connect with patients for Virtual Visits (Telemedicine).  Patients are able to view lab/test results, encounter notes, upcoming appointments, etc.  Non-urgent messages can be sent to your provider as well.   To learn more about what you can do with MyChart, go to [email or web address].    Your next appointment:   1 year(s)  Provider:   Tonny Bollman, MD     Other Instructions   1st  Floor: - Lobby - Registration  - Pharmacy  - Lab - Cafe  2nd Floor: - PV Lab - Diagnostic Testing (echo, CT, nuclear med)  3rd Floor: - Vacant  4th Floor: - TCTS (cardiothoracic surgery) - AFib Clinic - Structural Heart Clinic - Vascular Surgery  - Vascular Ultrasound  5th Floor: - HeartCare Cardiology (general and EP) - Clinical Pharmacy for coumadin, hypertension, lipid, weight-loss medications, and med management appointments    Valet parking services will be available as well.          Signed, Tonny Bollman, MD  05/20/2023 1:34 PM    [facility]

## 2023-05-20 NOTE — Patient Instructions (Signed)
 Follow-Up: At [facility], you and your health needs are our priority.  As part of our continuing mission to provide you with exceptional heart care, we have created designated Provider Care Teams.  These Care Teams include your primary Cardiologist (physician) and Advanced Practice Providers (APPs -  Physician Assistants and Nurse Practitioners) who all work together to provide you with the care you need, when you need it.  We recommend signing up for the patient portal called "MyChart".  Sign up information is provided on this After Visit Summary.  MyChart is used to connect with patients for Virtual Visits (Telemedicine).  Patients are able to view lab/test results, encounter notes, upcoming appointments, etc.  Non-urgent messages can be sent to your provider as well.   To learn more about what you can do with MyChart, go to [email or web address].    Your next appointment:   1 year(s)  Provider:   Tonny Bollman, MD     Other Instructions   1st Floor: - Lobby - Registration  - Pharmacy  - Lab - Cafe  2nd Floor: - PV Lab - Diagnostic Testing (echo, CT, nuclear med)  3rd Floor: - Vacant  4th Floor: - TCTS (cardiothoracic surgery) - AFib Clinic - Structural Heart Clinic - Vascular Surgery  - Vascular Ultrasound  5th Floor: - HeartCare Cardiology (general and EP) - Clinical Pharmacy for coumadin, hypertension, lipid, weight-loss medications, and med management appointments    Valet parking services will be available as well.

## 2023-05-20 NOTE — Assessment & Plan Note (Signed)
The patient remains clinically stable with no symptoms of angina on amlodipine and metoprolol for antianginal therapy, aspirin for antiplatelet therapy, and atorvastatin for lipid lowering.  No changes are made today.  I emphasized the importance of regular exercise and dietary modification.  He will return in 1 year for follow-up evaluation.

## 2023-06-13 DIAGNOSIS — M545 Low back pain, unspecified: Secondary | ICD-10-CM | POA: Diagnosis not present

## 2023-06-13 DIAGNOSIS — R739 Hyperglycemia, unspecified: Secondary | ICD-10-CM | POA: Diagnosis not present

## 2023-06-13 DIAGNOSIS — M069 Rheumatoid arthritis, unspecified: Secondary | ICD-10-CM | POA: Diagnosis not present

## 2023-06-13 DIAGNOSIS — Z79631 Long term (current) use of antimetabolite agent: Secondary | ICD-10-CM | POA: Diagnosis not present

## 2023-06-13 DIAGNOSIS — I10 Essential (primary) hypertension: Secondary | ICD-10-CM | POA: Diagnosis not present

## 2023-06-13 DIAGNOSIS — E7800 Pure hypercholesterolemia, unspecified: Secondary | ICD-10-CM | POA: Diagnosis not present

## 2023-06-13 DIAGNOSIS — Z0000 Encounter for general adult medical examination without abnormal findings: Secondary | ICD-10-CM | POA: Diagnosis not present

## 2023-06-13 DIAGNOSIS — D84821 Immunodeficiency due to drugs: Secondary | ICD-10-CM | POA: Diagnosis not present

## 2023-06-13 DIAGNOSIS — E559 Vitamin D deficiency, unspecified: Secondary | ICD-10-CM | POA: Diagnosis not present

## 2023-06-13 DIAGNOSIS — Z23 Encounter for immunization: Secondary | ICD-10-CM | POA: Diagnosis not present

## 2023-06-13 DIAGNOSIS — I2510 Atherosclerotic heart disease of native coronary artery without angina pectoris: Secondary | ICD-10-CM | POA: Diagnosis not present

## 2023-06-27 DIAGNOSIS — M0589 Other rheumatoid arthritis with rheumatoid factor of multiple sites: Secondary | ICD-10-CM | POA: Diagnosis not present

## 2023-08-12 DIAGNOSIS — M1991 Primary osteoarthritis, unspecified site: Secondary | ICD-10-CM | POA: Diagnosis not present

## 2023-08-12 DIAGNOSIS — Z79899 Other long term (current) drug therapy: Secondary | ICD-10-CM | POA: Diagnosis not present

## 2023-08-12 DIAGNOSIS — M0589 Other rheumatoid arthritis with rheumatoid factor of multiple sites: Secondary | ICD-10-CM | POA: Diagnosis not present

## 2023-08-12 DIAGNOSIS — D696 Thrombocytopenia, unspecified: Secondary | ICD-10-CM | POA: Diagnosis not present

## 2023-08-22 DIAGNOSIS — M0589 Other rheumatoid arthritis with rheumatoid factor of multiple sites: Secondary | ICD-10-CM | POA: Diagnosis not present

## 2023-10-17 DIAGNOSIS — M0589 Other rheumatoid arthritis with rheumatoid factor of multiple sites: Secondary | ICD-10-CM | POA: Diagnosis not present

## 2023-12-05 ENCOUNTER — Other Ambulatory Visit: Payer: Self-pay | Admitting: Physician Assistant

## 2023-12-12 DIAGNOSIS — L237 Allergic contact dermatitis due to plants, except food: Secondary | ICD-10-CM | POA: Diagnosis not present

## 2023-12-12 DIAGNOSIS — M0589 Other rheumatoid arthritis with rheumatoid factor of multiple sites: Secondary | ICD-10-CM | POA: Diagnosis not present

## 2023-12-12 DIAGNOSIS — Z79899 Other long term (current) drug therapy: Secondary | ICD-10-CM | POA: Diagnosis not present

## 2023-12-12 DIAGNOSIS — R5383 Other fatigue: Secondary | ICD-10-CM | POA: Diagnosis not present

## 2023-12-24 ENCOUNTER — Other Ambulatory Visit: Payer: Self-pay

## 2023-12-25 MED ORDER — BENAZEPRIL HCL 40 MG PO TABS: 40.0000 mg | ORAL_TABLET | Freq: Every day | ORAL | 1 refills | Status: DC

## 2024-01-03 DIAGNOSIS — Z79631 Long term (current) use of antimetabolite agent: Secondary | ICD-10-CM | POA: Diagnosis not present

## 2024-01-03 DIAGNOSIS — I2510 Atherosclerotic heart disease of native coronary artery without angina pectoris: Secondary | ICD-10-CM | POA: Diagnosis not present

## 2024-01-03 DIAGNOSIS — M069 Rheumatoid arthritis, unspecified: Secondary | ICD-10-CM | POA: Diagnosis not present

## 2024-01-03 DIAGNOSIS — I10 Essential (primary) hypertension: Secondary | ICD-10-CM | POA: Diagnosis not present

## 2024-01-03 DIAGNOSIS — D84821 Immunodeficiency due to drugs: Secondary | ICD-10-CM | POA: Diagnosis not present

## 2024-01-03 DIAGNOSIS — R21 Rash and other nonspecific skin eruption: Secondary | ICD-10-CM | POA: Diagnosis not present

## 2024-02-06 DIAGNOSIS — M0589 Other rheumatoid arthritis with rheumatoid factor of multiple sites: Secondary | ICD-10-CM | POA: Diagnosis not present

## 2024-02-12 DIAGNOSIS — M0589 Other rheumatoid arthritis with rheumatoid factor of multiple sites: Secondary | ICD-10-CM | POA: Diagnosis not present

## 2024-02-12 DIAGNOSIS — M1991 Primary osteoarthritis, unspecified site: Secondary | ICD-10-CM | POA: Diagnosis not present

## 2024-02-12 DIAGNOSIS — D696 Thrombocytopenia, unspecified: Secondary | ICD-10-CM | POA: Diagnosis not present

## 2024-02-12 DIAGNOSIS — Z79899 Other long term (current) drug therapy: Secondary | ICD-10-CM | POA: Diagnosis not present

## 2024-03-02 ENCOUNTER — Other Ambulatory Visit: Payer: Self-pay | Admitting: Cardiovascular Disease

## 2024-05-22 ENCOUNTER — Other Ambulatory Visit: Payer: Self-pay | Admitting: Cardiovascular Disease

## 2024-08-10 ENCOUNTER — Ambulatory Visit: Admitting: Cardiovascular Disease
